# Patient Record
Sex: Male | Born: 1975 | Race: White | Hispanic: No | Marital: Married | Smoking: Never smoker
Health system: Southern US, Community
[De-identification: ages and names within clinical notes are randomized; demographics above are authoritative.]

## PROBLEM LIST (undated history)

## (undated) DIAGNOSIS — I10 Essential (primary) hypertension: Secondary | ICD-10-CM

## (undated) DIAGNOSIS — K602 Anal fissure, unspecified: Secondary | ICD-10-CM

## (undated) DIAGNOSIS — N200 Calculus of kidney: Secondary | ICD-10-CM

## (undated) DIAGNOSIS — E785 Hyperlipidemia, unspecified: Secondary | ICD-10-CM

## (undated) DIAGNOSIS — E039 Hypothyroidism, unspecified: Secondary | ICD-10-CM

## (undated) DIAGNOSIS — Z8719 Personal history of other diseases of the digestive system: Secondary | ICD-10-CM

## (undated) HISTORY — DX: Hyperlipidemia, unspecified: E78.5

## (undated) HISTORY — DX: Essential (primary) hypertension: I10

## (undated) HISTORY — DX: Hypothyroidism, unspecified: E03.9

## (undated) HISTORY — PX: TONSILLECTOMY: SUR1361

## (undated) HISTORY — DX: Calculus of kidney: N20.0

## (undated) HISTORY — DX: Anal fissure, unspecified: K60.2

## (undated) HISTORY — PX: VASECTOMY: SHX75

---

## 2006-12-28 DIAGNOSIS — K602 Anal fissure, unspecified: Secondary | ICD-10-CM

## 2006-12-28 HISTORY — DX: Anal fissure, unspecified: K60.2

## 2009-02-28 ENCOUNTER — Other Ambulatory Visit: Admission: RE | Admit: 2009-02-28 | Discharge: 2009-02-28 | Payer: Self-pay | Admitting: Urology

## 2011-08-06 ENCOUNTER — Encounter: Payer: Self-pay | Admitting: Internal Medicine

## 2011-08-11 ENCOUNTER — Ambulatory Visit: Payer: Self-pay | Admitting: Internal Medicine

## 2011-09-07 ENCOUNTER — Encounter: Payer: Self-pay | Admitting: Internal Medicine

## 2011-09-07 ENCOUNTER — Ambulatory Visit (INDEPENDENT_AMBULATORY_CARE_PROVIDER_SITE_OTHER): Payer: Managed Care, Other (non HMO) | Admitting: Internal Medicine

## 2011-09-07 VITALS — BP 134/80 | HR 68 | Ht 73.0 in | Wt 192.0 lb

## 2011-09-07 DIAGNOSIS — I10 Essential (primary) hypertension: Secondary | ICD-10-CM | POA: Insufficient documentation

## 2011-09-07 DIAGNOSIS — K625 Hemorrhage of anus and rectum: Secondary | ICD-10-CM

## 2011-09-07 NOTE — Patient Instructions (Signed)
You have been scheduled for a flexible sigmoidoscopy.  Please see attached paperwork for instructions

## 2011-09-07 NOTE — Progress Notes (Signed)
Subjective:    Patient ID: James Blackburn, male    DOB: 29-Sep-1976, 35 y.o.   MRN: [id number]  HPI James Blackburn is a 35 year old male, accompanied today by his wife, seen in consultation at the request of Mr. Helene Kelp, PA-C for evaluation of BRBPR.  The patient states that over the last few months to as long as a year he has noted bright red blood on the toilet tissue with wiping. He also has, on rare occasion seen small amount around blood on the stool. He denies any other symptoms including no abdominal pain, nausea, vomiting, or weight loss. He reports his appetite is very good. He denies melena. He also has had no perianal pain. He denies tenesmus.  He reports very rare heartburn.  Regarding his stool, he reports usually having one to 2 formed brown stools per day. He denies constipation or the need to strain at stool. He also denies diarrhea. He's had no fevers or chills.  Review of Systems Constitutional: Negative for fever, chills, night sweats, activity change, appetite change and unexpected weight change HEENT: Negative for sore throat, mouth sores and trouble swallowing. Eyes: Negative for visual disturbance Respiratory: Negative for cough, chest tightness and shortness of breath Cardiovascular: Negative for chest pain, palpitations and lower extremity swelling Gastrointestinal: See history of present illness Genitourinary: Negative for dysuria and hematuria. Musculoskeletal: Negative for back pain, arthralgias and myalgias Skin: Negative for rash or color change Neurological: Negative for headaches, weakness, numbness Hematological: Negative for adenopathy, negative for easy bruising/bleeding Psychiatric/behavioral: Negative for depressed mood, negative for anxiety  PMH:  1.HTN  PSH: none  Current outpatient prescriptions:lisinopril (PRINIVIL,ZESTRIL) 20 MG tablet, One tablet by mouth once daily , Disp: , Rfl: ;  hydrocortisone (ANUSOL-HC) 25 MG suppository, Place 25 mg  rectally 2 (two) times daily as needed.  , Disp: , Rfl:   Allergies  Allergen Reactions  . Amoxicillin    Family History  Problem Relation Age of Onset  . Colon cancer Neg Hx   -Neg IBD, neg colon polyps. Father died of suicide Mother died of COPD  Social History  . Marital Status: Married    Number of Children: 2  . Years of Education: N/A   Occupational History  . [employer]   Social History Main Topics  . Smoking status: Never Smoker   . Smokeless tobacco: Never Used  . Alcohol Use: No  . Drug Use: No   Social History Narrative   3 caffeine drinks daily       Objective:   Physical Exam BP 134/80  Pulse 68  Ht 6\' 1"  (1.854 m)  Wt 192 lb (87.091 kg)  BMI 25.33 kg/m2 Constitutional: Well-developed and well-nourished. No distress. HEENT: Normocephalic and atraumatic. Oropharynx is clear and moist. No oropharyngeal exudate. Conjunctivae are normal. Pupils are equal round and reactive to light. No scleral icterus. Neck: Neck supple. Trachea midline. Cardiovascular: Normal rate, regular rhythm and intact distal pulses. No M/R/G Pulmonary/chest: Effort normal and breath sounds normal. No wheezing, rales or rhonchi. Abdominal: Soft, nontender, nondistended. Bowel sounds active throughout. There are no masses palpable. No hepatosplenomegaly. Rectal: External exam is normal with no emesis hemorrhoid or fissure. Internal exam with no masses, scant brown stool in the vault Lymphadenopathy: No cervical adenopathy noted. Neurological: Alert and oriented to person place and time. Skin: Erythema on sun-exposed areas consistent with sunbrun, Skin is warm and dry. No rashes noted. Psychiatric: Normal mood and affect. Behavior is normal.  Labs 07/31/2011 Fecal  occult blood negative WBC 5.9 hemolymph 15.6 hematocrit 45.1 MCV 90.1 platelet count 237 Sodium 141 potassium 3.7 chloride 103 CO2 26 BUN 8 creatinine 0.75 glucose 103 Total bili 0.6 alkaline phosphatase a 63 AST  28 ALT 33 Total protein 7.3 Alb 4.4 calcium 9.5     Assessment & Plan:  This is a 35 year old male with a past medical history of hypertension who presents with scant rectal bleeding over a year period.  1. Intermittent rectal bleeding - the patient has minimal symptoms however given the length of time that he has seen blood associated with bowel movement we will proceed with a flexible sigmoidoscopy. He would prefer this to be sedated and therefore we will arrange this for him. I do not see any evidence of external hemorrhoid or fissure on exam today. Flexible sigmoidoscopy will examine for inflammation/proctitis as well as internal hemorrhoids.  The need for follow will be based on findings a flexible sigmoidoscopy. I have asked the patient to contact my office should symptoms change or worsen prior to this exam.

## 2011-09-29 ENCOUNTER — Other Ambulatory Visit: Payer: Managed Care, Other (non HMO) | Admitting: Internal Medicine

## 2012-02-05 ENCOUNTER — Ambulatory Visit
Admission: RE | Admit: 2012-02-05 | Discharge: 2012-02-05 | Disposition: A | Discharge status: Home / Self Care | Payer: Managed Care, Other (non HMO) | Source: Ambulatory Visit | Attending: Orthopedic Surgery | Admitting: Orthopedic Surgery

## 2012-02-05 ENCOUNTER — Other Ambulatory Visit: Payer: Self-pay | Admitting: Orthopedic Surgery

## 2012-02-05 DIAGNOSIS — R52 Pain, unspecified: Secondary | ICD-10-CM

## 2012-02-26 ENCOUNTER — Ambulatory Visit
Admission: RE | Admit: 2012-02-26 | Discharge: 2012-02-26 | Disposition: A | Discharge status: Home / Self Care | Payer: Commercial Indemnity | Source: Ambulatory Visit | Attending: Orthopedic Surgery | Admitting: Orthopedic Surgery

## 2012-02-26 ENCOUNTER — Other Ambulatory Visit: Payer: Self-pay | Admitting: Orthopedic Surgery

## 2012-02-26 DIAGNOSIS — M25539 Pain in unspecified wrist: Secondary | ICD-10-CM

## 2012-02-26 DIAGNOSIS — M25531 Pain in right wrist: Secondary | ICD-10-CM

## 2012-10-21 IMAGING — CR DG WRIST 2V*L*
1 series · 1 of 1 positions shown · non-contrast
Comparison: Radiographs dated 02/05/2012

CLINICAL DATA: Persistent right wrist pain and limited range of
motion since distal radius fracture.

LEFT WRIST - 1 view clinched fist

[view not recorded]
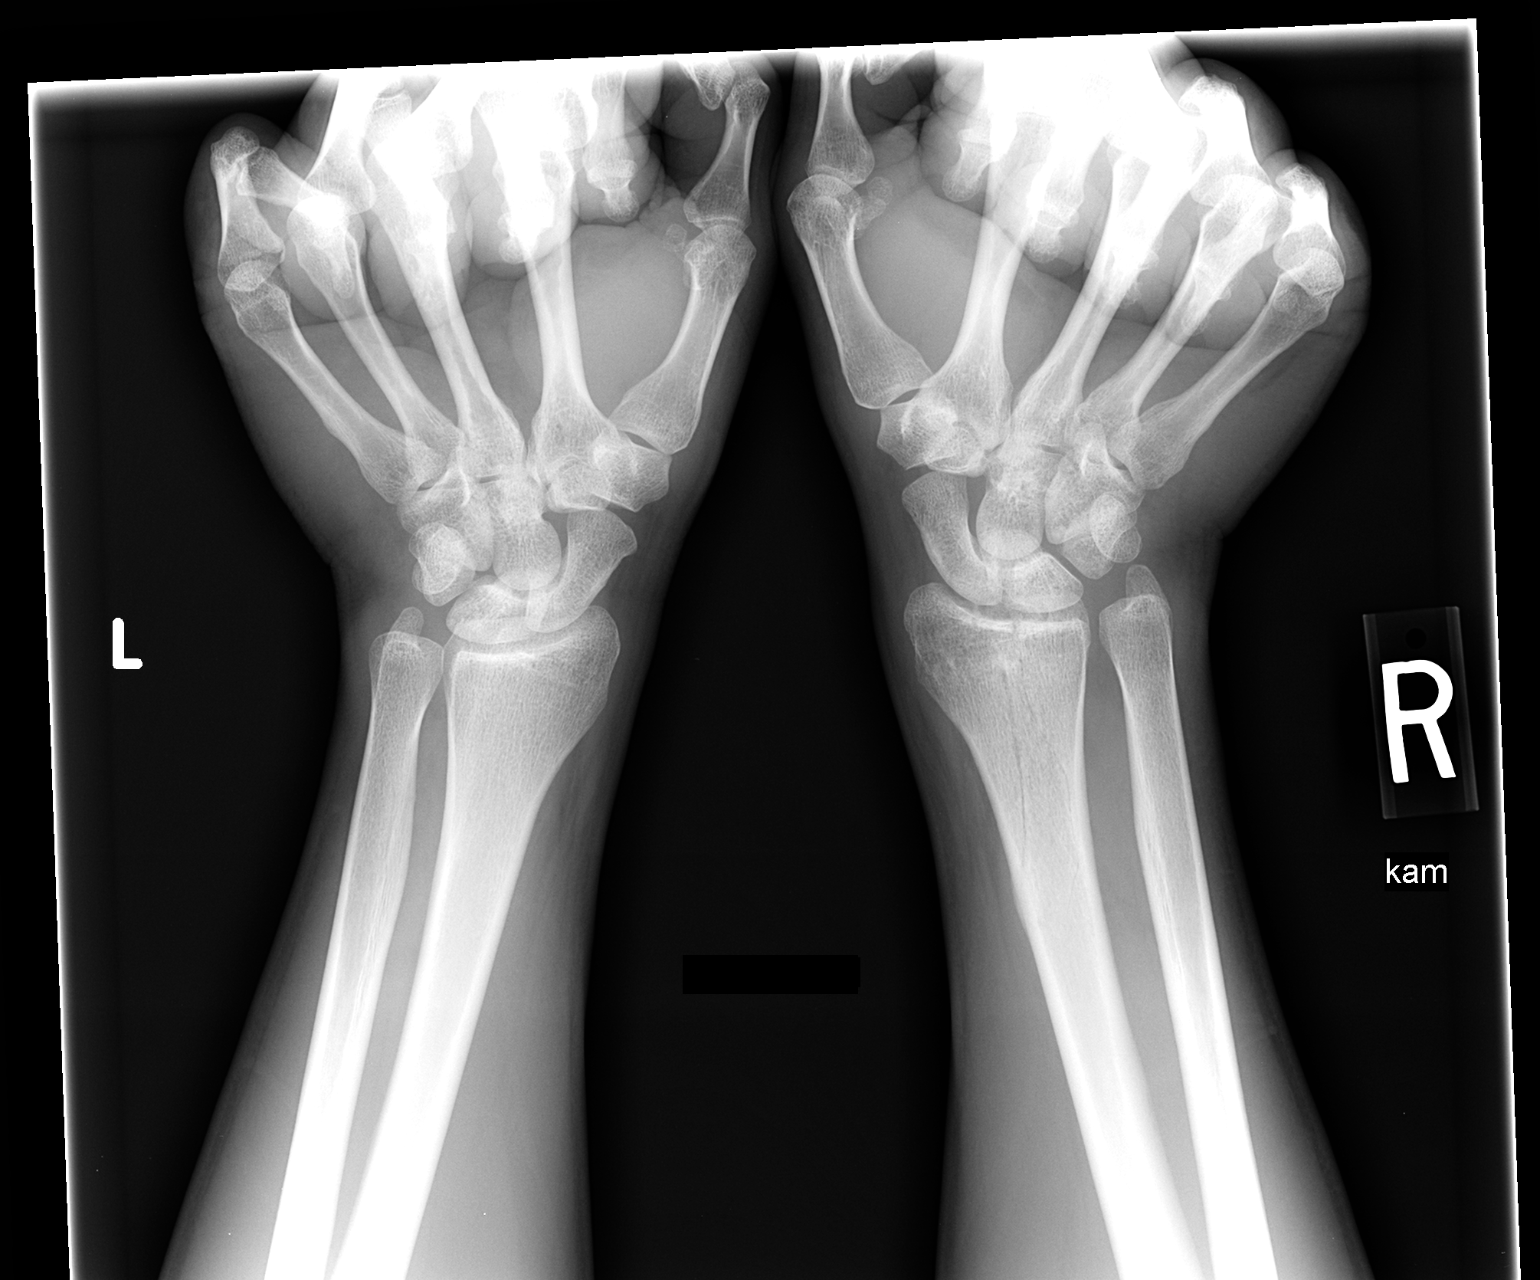

[1 of 1 positions shown; findings below may reference images not displayed]

FINDINGS: There is no appreciable widening of the scapholunate
distance in the right or left wrist.

However, there is abnormal widening of the right distal radial
ulnar joint as compared to the left.

The patient has incomplete healing of the fracture of the distal
right radius.  Other osseous structures are normal.
IMPRESSION: 1.  Abnormal widening of the distal right radial ulnar joint in the
clenched fist position.
2.  No abnormal widening of the scapholunate space.
3.  Incomplete healing of the distal radius fracture.

## 2013-05-23 ENCOUNTER — Telehealth: Payer: Self-pay | Admitting: Family Medicine

## 2013-05-23 ENCOUNTER — Ambulatory Visit (INDEPENDENT_AMBULATORY_CARE_PROVIDER_SITE_OTHER): Payer: Managed Care, Other (non HMO) | Admitting: Family Medicine

## 2013-05-23 ENCOUNTER — Encounter: Payer: Self-pay | Admitting: Family Medicine

## 2013-05-23 ENCOUNTER — Ambulatory Visit (INDEPENDENT_AMBULATORY_CARE_PROVIDER_SITE_OTHER): Payer: Managed Care, Other (non HMO)

## 2013-05-23 ENCOUNTER — Ambulatory Visit
Admission: RE | Admit: 2013-05-23 | Discharge: 2013-05-23 | Disposition: A | Discharge status: Home / Self Care | Payer: Managed Care, Other (non HMO) | Source: Ambulatory Visit | Attending: Family Medicine | Admitting: Family Medicine

## 2013-05-23 VITALS — BP 138/89 | HR 69 | Temp 98.0°F | Ht 73.0 in | Wt 200.0 lb

## 2013-05-23 DIAGNOSIS — N200 Calculus of kidney: Secondary | ICD-10-CM

## 2013-05-23 DIAGNOSIS — R319 Hematuria, unspecified: Secondary | ICD-10-CM

## 2013-05-23 DIAGNOSIS — E785 Hyperlipidemia, unspecified: Secondary | ICD-10-CM

## 2013-05-23 LAB — POCT UA - MICROSCOPIC ONLY
Bacteria, U Microscopic: NEGATIVE
Crystals, Ur, HPF, POC: NEGATIVE
Epithelial cells, urine per micros: NEGATIVE
WBC, Ur, HPF, POC: NEGATIVE

## 2013-05-23 LAB — POCT URINALYSIS DIPSTICK
Bilirubin, UA: NEGATIVE
Glucose, UA: NEGATIVE
Spec Grav, UA: 1.015
Urobilinogen, UA: NEGATIVE

## 2013-05-23 NOTE — Patient Instructions (Addendum)
Drink lots of fluids Try  using filter, to attempt to catch stone If in severe pain call sooner

## 2013-05-23 NOTE — Addendum Note (Signed)
Addended by: Gwenith Daily on: 05/23/2013 12:48 PM   Modules accepted: Orders

## 2013-05-23 NOTE — Telephone Encounter (Signed)
APPT MADE

## 2013-05-23 NOTE — Progress Notes (Signed)
  Subjective:    Patient ID: James Blackburn, male    DOB: 10/23/1976, 37 y.o.   MRN: [id number]  HPI Patient notes having blood in the urine since Saturday evening which was 3 days ago. The urine was pretty dark that particular night. He does have playing basketball with his son that day. He has had slight increase in frequency. He notes no urgency or stinging with voiding. He did have some slight testicular pain yesterday. He denies previous history of stone or a family history of stones. Last week there was some slight left flank pain. Since the bleeding started he noticed that it seems to be clearing more today.  Review of Systems  Genitourinary: Positive for hematuria (started on Saturday). Negative for urgency, frequency, flank pain and difficulty urinating.       Objective:   Physical Exam  Constitutional: He is oriented to person, place, and time. He appears well-developed and well-nourished.  HENT:  Head: Normocephalic.  Eyes: Conjunctivae are normal.  Neck: Normal range of motion.  Abdominal: Soft. Bowel sounds are normal. He exhibits no mass. There is tenderness (slight suprapubic). There is no rebound and no guarding.  Genitourinary: Penis normal. No penile tenderness.  Testicles were normal bilaterally. There was a small left inguinal hernia.  Musculoskeletal: Normal range of motion.  Neurological: He is alert and oriented to person, place, and time.  Skin: Skin is warm and dry.  Psychiatric: He has a normal mood and affect. His behavior is normal. Judgment and thought content normal.   [facility] reading (PRIMARY) by  Dr. Christell Constant; KUB-- no obvious stone                                        Assessment & Plan:  1. Hematuria -Urinalysis  2. Suprapubic pain  3. Small left inguinal hernia  Patient Instructions  Drink lots of fluids Try  using filter, to attempt to catch stone If in severe pain call sooner   Will get CT scan of abdomen and pelvis

## 2013-05-26 ENCOUNTER — Telehealth: Payer: Self-pay | Admitting: *Deleted

## 2013-05-26 NOTE — Telephone Encounter (Signed)
[name] last week for kidney stones. Unable to pass any stones. Had CT done that did show stones. DW[facility] told to call back if unable to pass stones and having bleeding and if having pain with urination. Please advise

## 2013-05-26 NOTE — Telephone Encounter (Signed)
Will have to go to er- To late in day for urology referral

## 2013-05-27 NOTE — Telephone Encounter (Signed)
Patient aware.

## 2013-05-29 ENCOUNTER — Telehealth: Payer: Self-pay | Admitting: Family Medicine

## 2013-05-29 NOTE — Telephone Encounter (Signed)
Please try to get pt's appt with urology ASAP due to worsening hematuria

## 2013-06-12 ENCOUNTER — Ambulatory Visit: Payer: Managed Care, Other (non HMO) | Admitting: Physician Assistant

## 2013-06-12 ENCOUNTER — Other Ambulatory Visit: Payer: Self-pay | Admitting: Urology

## 2013-06-19 ENCOUNTER — Encounter (HOSPITAL_COMMUNITY): Payer: Self-pay | Admitting: Pharmacy Technician

## 2013-06-20 ENCOUNTER — Encounter (HOSPITAL_COMMUNITY): Payer: Self-pay | Admitting: *Deleted

## 2013-06-21 NOTE — H&P (Signed)
ctive Problems Problems  1. Nephrolithiasis 592.0  History of Present Illness    James Blackburn is a 37 yo WM sent in consultation by Dr. Christell Constant for right renal stones.   He had the onset last month of gross hematuria.  He has had some intermittant pain in the right flank.  It can be severe and radiates to the groin.  He had a CT that showed a 7mm right renal pelvic stone and a 2 mm stones.   He has had no prior stones or GU surgery.  He has had no frequency or urgency.   Past Medical History Problems  1. History of  Hypercholesterolemia 272.0 2. History of  Hypertension 401.9 3. History of  Nephrolithiasis V13.01  Surgical History Problems  1. History of  Surgery Of Male Genitalia Vasectomy V25.2  Current Meds 1. Lisinopril 20 MG Oral Tablet; Therapy: 26Dec2013 to  Allergies Medication  1. Amoxicillin CAPS  Family History Problems  1. Family history of  Death In The Family Father 2. Family history of  Death In The Family Mother 3. Family history of  Family Health Status Number Of Children  Social History Problems    Caffeine Use   Marital History - Currently Married   Occupation: Denied    History of  Alcohol Use   History of  Tobacco Use V15.82  Review of Systems Genitourinary, constitutional, skin, eye, otolaryngeal, hematologic/lymphatic, cardiovascular, pulmonary, endocrine, musculoskeletal, gastrointestinal, neurological and psychiatric system(s) were reviewed and pertinent findings if present are noted.  Genitourinary: urinary frequency and hematuria.  Gastrointestinal: flank pain.    Vitals Vital Signs [Data Includes: Last 1 Day]  16Jun2014 01:59PM  BMI Calculated: 26.51 BSA Calculated: 2.15 Height: 6 ft 1 in Weight: 200 lb  Blood Pressure: 135 / 96 Temperature: 98.3 F Heart Rate: 84  Physical Exam Constitutional: Well nourished and well developed . No acute distress.  ENT:. The ears and nose are normal in appearance.  Neck: The appearance of the neck  is normal and no neck mass is present.  Pulmonary: No respiratory distress and normal respiratory rhythm and effort.  Cardiovascular: Heart rate and rhythm are normal . No peripheral edema.  Abdomen: No masses are palpated. The abdomen is not firm, not rigid and no rebound. Tenderness in the RLQ is present. mild right CVA tenderness.  A right inguinal hernia is present.  A left inguinal hernia is present. No hepatosplenomegaly noted.  Genitourinary: Examination of the penis demonstrates no discharge, no masses, no lesions and a normal meatus. The scrotum is without lesions. The right epididymis is palpably normal and non-tender. The left epididymis is palpably normal and non-tender. The right testis is non-tender and without masses. The left testis is non-tender and without masses.  Lymphatics: The femoral and inguinal nodes are not enlarged or tender.  Skin: Normal skin turgor, no visible rash and no visible skin lesions.  Neuro/Psych:. Mood and affect are appropriate.    Results/Data Urine [Data Includes: Last 1 Day]   16Jun2014  COLOR YELLOW   APPEARANCE CLOUDY   SPECIFIC GRAVITY 1.020   pH 6.5   GLUCOSE NEG mg/dL  BILIRUBIN NEG   KETONE NEG mg/dL  BLOOD LARGE   PROTEIN NEG mg/dL  UROBILINOGEN 0.2 mg/dL  NITRITE NEG   LEUKOCYTE ESTERASE NEG   SQUAMOUS EPITHELIAL/HPF RARE   WBC 0-2 WBC/hpf  RBC TNTC RBC/hpf  BACTERIA FEW   CRYSTALS NONE SEEN   CASTS NONE SEEN   Other MUCUS NOTED    Old records  or history reviewed: I have reviewed records from Dr. Christell Constant.  The following images/tracing/specimen were independently visualized:  I have reviewed his CT films and report. KUB today shows the stone has moved into the right proximal ureter and is 8x77mm in size. The films is otherwise unremarkable.    Assessment Assessed  1. Proximal Ureteral Stone On The Right 592.1   He has a symptomatic right proximal stone but took ibuprofen this morning.   Plan Health Maintenance (V70.0)  1.  UA With REFLEX  Done: 16Jun2014 01:39PM Nephrolithiasis (592.0)  2. KUB  Done: 16Jun2014 12:00AM Proximal Ureteral Stone On The Right (592.1)  3. Oxycodone-Acetaminophen 5-325 MG Oral Tablet; take 1 or 2 tablets q 4-6 hours prn pain;  Therapy: 16Jun2014 to (Last Rx:16Jun2014) 4. Follow-up Schedule Surgery Office  Follow-up  Requested for: 16Jun2014   I discussed the options including observation, ESWL and ureteroscopy and will set him up for ESWL later this week.   I reviewed the risks of bleeding, infection, renal and adjacent organ injury that can be life threatening or fatal, thrombotic events, need for secondary procedures for obstructing fragments and sedation risks.   Discussion/Summary  CC: Dr. Vernon Prey.

## 2013-06-22 ENCOUNTER — Ambulatory Visit (HOSPITAL_COMMUNITY): Payer: Managed Care, Other (non HMO)

## 2013-06-22 ENCOUNTER — Ambulatory Visit (HOSPITAL_COMMUNITY)
Admission: RE | Admit: 2013-06-22 | Discharge: 2013-06-22 | Disposition: A | Discharge status: Home / Self Care | Payer: Managed Care, Other (non HMO) | Source: Ambulatory Visit | Attending: Urology | Admitting: Urology

## 2013-06-22 ENCOUNTER — Encounter (HOSPITAL_COMMUNITY): Payer: Self-pay

## 2013-06-22 ENCOUNTER — Encounter (HOSPITAL_COMMUNITY)
Admission: RE | Disposition: A | Discharge status: Home / Self Care | Payer: Self-pay | Source: Ambulatory Visit | Attending: Urology

## 2013-06-22 DIAGNOSIS — I10 Essential (primary) hypertension: Secondary | ICD-10-CM | POA: Insufficient documentation

## 2013-06-22 DIAGNOSIS — N200 Calculus of kidney: Secondary | ICD-10-CM | POA: Insufficient documentation

## 2013-06-22 DIAGNOSIS — E7800 Pure hypercholesterolemia, unspecified: Secondary | ICD-10-CM | POA: Insufficient documentation

## 2013-06-22 DIAGNOSIS — K4020 Bilateral inguinal hernia, without obstruction or gangrene, not specified as recurrent: Secondary | ICD-10-CM | POA: Insufficient documentation

## 2013-06-22 DIAGNOSIS — Z880 Allergy status to penicillin: Secondary | ICD-10-CM | POA: Insufficient documentation

## 2013-06-22 DIAGNOSIS — Z79899 Other long term (current) drug therapy: Secondary | ICD-10-CM | POA: Insufficient documentation

## 2013-06-22 DIAGNOSIS — Z9852 Vasectomy status: Secondary | ICD-10-CM | POA: Insufficient documentation

## 2013-06-22 HISTORY — DX: Personal history of other diseases of the digestive system: Z87.19

## 2013-06-22 SURGERY — LITHOTRIPSY, ESWL
Anesthesia: LOCAL | Laterality: Right

## 2013-06-22 MED ORDER — ACETAMINOPHEN 325 MG PO TABS: 650.0000 mg | ORAL_TABLET | ORAL | Status: DC | PRN

## 2013-06-22 MED ORDER — CIPROFLOXACIN HCL 500 MG PO TABS
500.0000 mg | ORAL_TABLET | ORAL | Status: AC
  Administered 2013-06-22: 500 mg via ORAL
  Filled 2013-06-22: qty 1

## 2013-06-22 MED ORDER — SODIUM CHLORIDE 0.9 % IV SOLN: 250.0000 mL | INTRAVENOUS | Status: DC | PRN

## 2013-06-22 MED ORDER — DEXTROSE-NACL 5-0.45 % IV SOLN
INTRAVENOUS | Status: DC
  Administered 2013-06-22: 10:00:00 via INTRAVENOUS

## 2013-06-22 MED ORDER — TAMSULOSIN HCL 0.4 MG PO CAPS: 0.4000 mg | ORAL_CAPSULE | Freq: Every day | ORAL | Status: DC

## 2013-06-22 MED ORDER — ONDANSETRON 8 MG PO TBDP: 8.0000 mg | ORAL_TABLET | Freq: Three times a day (TID) | ORAL | Status: DC | PRN

## 2013-06-22 MED ORDER — DIAZEPAM 5 MG PO TABS
10.0000 mg | ORAL_TABLET | ORAL | Status: AC
  Administered 2013-06-22: 10 mg via ORAL
  Filled 2013-06-22: qty 2

## 2013-06-22 MED ORDER — OXYCODONE-ACETAMINOPHEN 5-325 MG PO TABS: 1.0000 | ORAL_TABLET | ORAL | Status: DC | PRN

## 2013-06-22 MED ORDER — ACETAMINOPHEN 650 MG RE SUPP
650.0000 mg | RECTAL | Status: DC | PRN
  Filled 2013-06-22: qty 1

## 2013-06-22 MED ORDER — ONDANSETRON HCL 4 MG/2ML IJ SOLN: 4.0000 mg | Freq: Four times a day (QID) | INTRAMUSCULAR | Status: DC | PRN

## 2013-06-22 MED ORDER — OXYCODONE HCL 5 MG PO TABS
5.0000 mg | ORAL_TABLET | ORAL | Status: DC | PRN
  Administered 2013-06-22: 10 mg via ORAL
  Filled 2013-06-22: qty 2

## 2013-06-22 MED ORDER — DIPHENHYDRAMINE HCL 25 MG PO CAPS
25.0000 mg | ORAL_CAPSULE | ORAL | Status: AC
  Administered 2013-06-22: 25 mg via ORAL
  Filled 2013-06-22: qty 1

## 2013-06-22 MED ORDER — SODIUM CHLORIDE 0.9 % IJ SOLN: 3.0000 mL | INTRAMUSCULAR | Status: DC | PRN

## 2013-06-22 MED ORDER — FENTANYL CITRATE 0.05 MG/ML IJ SOLN: 25.0000 ug | INTRAMUSCULAR | Status: DC | PRN

## 2013-06-22 MED ORDER — SODIUM CHLORIDE 0.9 % IJ SOLN: 3.0000 mL | Freq: Two times a day (BID) | INTRAMUSCULAR | Status: DC

## 2013-07-03 ENCOUNTER — Other Ambulatory Visit: Payer: Self-pay | Admitting: *Deleted

## 2013-07-03 ENCOUNTER — Other Ambulatory Visit: Payer: Self-pay | Admitting: Family Medicine

## 2013-07-03 ENCOUNTER — Telehealth: Payer: Self-pay | Admitting: Family Medicine

## 2013-07-03 DIAGNOSIS — I10 Essential (primary) hypertension: Secondary | ICD-10-CM

## 2013-07-03 MED ORDER — LISINOPRIL 20 MG PO TABS: 20.0000 mg | ORAL_TABLET | Freq: Every day | ORAL | Status: DC

## 2013-07-03 NOTE — Telephone Encounter (Addendum)
Spoke with pt and was on vacation last week and needs bp med asap  lisonpril 20 mg bp was 160/98 this am and last week when had liportripsy was 160/98  Needs office visit and was sch for Thursday.  Send [facility]

## 2013-07-03 NOTE — Telephone Encounter (Signed)
Patient out of BP med needs this called in asap he called it in this morning and no one has called him back needs to be done asap please call him when ready

## 2013-07-03 NOTE — Telephone Encounter (Signed)
Out of bp med today, appt for tomorrow with [facility]. Needs refill.

## 2013-07-04 ENCOUNTER — Ambulatory Visit: Payer: Managed Care, Other (non HMO) | Admitting: Family Medicine

## 2013-07-06 ENCOUNTER — Encounter: Payer: Self-pay | Admitting: Family Medicine

## 2013-07-06 ENCOUNTER — Ambulatory Visit (INDEPENDENT_AMBULATORY_CARE_PROVIDER_SITE_OTHER): Payer: Managed Care, Other (non HMO) | Admitting: Family Medicine

## 2013-07-06 VITALS — BP 143/83 | HR 77 | Temp 97.4°F | Ht 73.0 in | Wt 200.0 lb

## 2013-07-06 DIAGNOSIS — I10 Essential (primary) hypertension: Secondary | ICD-10-CM

## 2013-07-06 DIAGNOSIS — E785 Hyperlipidemia, unspecified: Secondary | ICD-10-CM

## 2013-07-06 MED ORDER — ATORVASTATIN CALCIUM 40 MG PO TABS: 40.0000 mg | ORAL_TABLET | Freq: Every day | ORAL | Status: DC

## 2013-07-06 MED ORDER — LISINOPRIL 20 MG PO TABS: 20.0000 mg | ORAL_TABLET | Freq: Every day | ORAL | Status: DC

## 2013-07-06 NOTE — Patient Instructions (Addendum)

## 2013-07-06 NOTE — Progress Notes (Signed)
  Subjective:    Patient ID: James Blackburn, male    DOB: Oct 03, 1976, 37 y.o.   MRN: [id number]  HPI This 37 y.o. male presents for evaluation of hypertension and hyperlipidemia.  He needs refills on his lipitor and his  Lisinopril.   Review of Systems No chest pain, SOB, HA, dizziness, vision change, N/V, diarrhea, constipation, dysuria, urinary urgency or frequency, myalgias, arthralgias or rash.     Objective:   Physical Exam  Vital signs noted  Well developed well nourished male.  HEENT - Head atraumatic Normocephalic                Eyes - PERRLA, Conjuctiva - clear Sclera- Clear EOMI                Ears - EAC's Wnl TM's Wnl Gross Hearing WNL                Nose - Nares patent                 Throat - oropharanx wnl Respiratory - Lungs CTA bilateral Cardiac - RRR S1 and S2 without murmur GI - Abdomen soft Nontender and bowel sounds active x 4 Extremities - No edema. Neuro - Grossly intact.      Assessment & Plan:  HTN (hypertension) - Plan: lisinopril (PRINIVIL,ZESTRIL) 20 MG tablet, atorvastatin (LIPITOR) 40 MG tablet Exercise and recommend DASH diet, follow up in 6 months.  Other and unspecified hyperlipidemia - Continue Atrovastatin 40mg  po qd.  Follow up in 6 months.

## 2013-08-14 ENCOUNTER — Telehealth: Payer: Self-pay | Admitting: Family Medicine

## 2013-08-14 NOTE — Telephone Encounter (Signed)
Appt given for today 

## 2013-08-15 ENCOUNTER — Encounter: Payer: Self-pay | Admitting: Family Medicine

## 2013-08-15 ENCOUNTER — Ambulatory Visit (INDEPENDENT_AMBULATORY_CARE_PROVIDER_SITE_OTHER): Payer: Managed Care, Other (non HMO) | Admitting: Family Medicine

## 2013-08-15 VITALS — BP 135/94 | HR 84 | Temp 97.7°F | Ht 73.0 in | Wt 198.6 lb

## 2013-08-15 DIAGNOSIS — L259 Unspecified contact dermatitis, unspecified cause: Secondary | ICD-10-CM

## 2013-08-15 MED ORDER — MOMETASONE FUROATE 0.1 % EX CREA: TOPICAL_CREAM | Freq: Every day | CUTANEOUS | Status: DC

## 2013-08-15 MED ORDER — METHYLPREDNISOLONE SODIUM SUCC 125 MG IJ SOLR
125.0000 mg | Freq: Once | INTRAMUSCULAR | Status: AC
  Administered 2013-08-15: 125 mg via INTRAMUSCULAR

## 2013-08-15 MED ORDER — HYDROXYZINE HCL 25 MG PO TABS: 25.0000 mg | ORAL_TABLET | ORAL | Status: DC | PRN

## 2013-08-15 MED ORDER — METHYLPREDNISOLONE 4 MG PO KIT: PACK | ORAL | Status: DC

## 2013-08-15 MED ORDER — SODIUM CHLORIDE 0.9 % IV SOLN: 125.0000 mg | Freq: Once | INTRAVENOUS | Status: DC

## 2013-08-15 NOTE — Progress Notes (Signed)
  Subjective:    Patient ID: James Blackburn, male    DOB: [birth year], 37 y.o.   MRN: [id number]  HPI  This 37 y.o. male presents for evaluation of contact dermatitis from poison oak after working out in the yard. He has been using benadryl and hydrocortisone cream which is not helping.  Review of Systems C/o rash and pruritis   No chest pain, SOB, HA, dizziness, vision change, N/V, diarrhea, constipation, dysuria, urinary urgency or frequency, myalgias, arthralgias.  Objective:   Physical Exam Vital signs noted  Well developed well nourished male.  HEENT - Head atraumatic Normocephalic                Eyes - PERRLA, Conjuctiva - clear Sclera- Clear EOMI                Ears - EAC's Wnl TM's Wnl Gross Hearing WNL                Nose - Nares patent                 Throat - oropharanx wnl Respiratory - Lungs CTA bilateral Cardiac - RRR S1 and S2 without murmur Skin - Erythematous blisters and vesicles over hands, arms, wrists, and abdomen with yellow exudate.       Assessment & Plan:  Contact dermatitis - Plan: methylPREDNISolone (MEDROL DOSEPAK) 4 MG tablet, mometasone (ELOCON) 0.1 % cream, hydrOXYzine (ATARAX/VISTARIL) tablet 25 mg, Solumedrol 125mg  IM.  Aveeno baths and follow up prn if not better.

## 2013-08-15 NOTE — Addendum Note (Signed)
Addended by: Pura Spice on: 08/15/2013 05:41 PM   Modules accepted: Orders

## 2013-08-15 NOTE — Progress Notes (Signed)
Tolerated solu medrol 125 mg IM without difficulty.

## 2013-08-15 NOTE — Patient Instructions (Addendum)
Contact Dermatitis Contact dermatitis is a reaction to certain substances that touch the skin. Contact dermatitis can be either irritant contact dermatitis or allergic contact dermatitis. Irritant contact dermatitis does not require previous exposure to the substance for a reaction to occur.Allergic contact dermatitis only occurs if you have been exposed to the substance before. Upon a repeat exposure, your body reacts to the substance.  CAUSES  Many substances can cause contact dermatitis. Irritant dermatitis is most commonly caused by repeated exposure to mildly irritating substances, such as:  Makeup.  Soaps.  Detergents.  Bleaches.  Acids.  Metal salts, such as nickel. Allergic contact dermatitis is most commonly caused by exposure to:  Poisonous plants.  Chemicals (deodorants, shampoos).  Jewelry.  Latex.  Neomycin in triple antibiotic cream.  Preservatives in products, including clothing. SYMPTOMS  The area of skin that is exposed may develop:  Dryness or flaking.  Redness.  Cracks.  Itching.  Pain or a burning sensation.  Blisters. With allergic contact dermatitis, there may also be swelling in areas such as the eyelids, mouth, or genitals.  DIAGNOSIS  Your caregiver can usually tell what the problem is by doing a physical exam. In cases where the cause is uncertain and an allergic contact dermatitis is suspected, a patch skin test may be performed to help determine the cause of your dermatitis. TREATMENT Treatment includes protecting the skin from further contact with the irritating substance by avoiding that substance if possible. Barrier creams, powders, and gloves may be helpful. Your caregiver may also recommend:  Steroid creams or ointments applied 2 times daily. For best results, soak the rash area in cool water for 20 minutes. Then apply the medicine. Cover the area with a plastic wrap. You can store the steroid cream in the refrigerator for a "chilly"  effect on your rash. That may decrease itching. Oral steroid medicines may be needed in more severe cases.  Antibiotics or antibacterial ointments if a skin infection is present.  Antihistamine lotion or an antihistamine taken by mouth to ease itching.  Lubricants to keep moisture in your skin.  Burow's solution to reduce redness and soreness or to dry a weeping rash. Mix one packet or tablet of solution in 2 cups cool water. Dip a clean washcloth in the mixture, wring it out a bit, and put it on the affected area. Leave the cloth in place for 30 minutes. Do this as often as possible throughout the day.  Taking several cornstarch or baking soda baths daily if the area is too large to cover with a washcloth. Harsh chemicals, such as alkalis or acids, can cause skin damage that is like a burn. You should flush your skin for 15 to 20 minutes with cold water after such an exposure. You should also seek immediate medical care after exposure. Bandages (dressings), antibiotics, and pain medicine may be needed for severely irritated skin.  HOME CARE INSTRUCTIONS  Avoid the substance that caused your reaction.  Keep the area of skin that is affected away from hot water, soap, sunlight, chemicals, acidic substances, or anything else that would irritate your skin.  Do not scratch the rash. Scratching may cause the rash to become infected.  You may take cool baths to help stop the itching.  Only take over-the-counter or prescription medicines as directed by your caregiver.  See your caregiver for follow-up care as directed to make sure your skin is healing properly. SEEK MEDICAL CARE IF:   Your condition is not better after 3   days of treatment.  You seem to be getting worse.  You see signs of infection such as swelling, tenderness, redness, soreness, or warmth in the affected area.  You have any problems related to your medicines. Document Released: 12/11/2000 Document Revised: 03/07/2012  Document Reviewed: 05/19/2011 [facility] Patient Information 2014 [place], [place]. Methylprednisolone Solution for Injection What is this medicine? METHYLPREDNISOLONE (meth ill pred NISS oh lone) is a corticosteroid. It is commonly used to treat inflammation of the skin, joints, lungs, and other organs. Common conditions treated include asthma, allergies, and arthritis. It is also used for other conditions, such as blood disorders and diseases of the adrenal glands. This medicine may be used for other purposes; ask your health care provider or pharmacist if you have questions. What should I tell my health care provider before I take this medicine? They need to know if you have any of these conditions: -cataracts or glaucoma -Cushing's syndrome -heart disease -high blood pressure -infection including tuberculosis -low calcium or potassium levels in the blood -recent surgery -seizures -stomach or intestinal disease, including colitis -threadworms -thyroid problems -an unusual or allergic reaction to methylprednisolone, corticosteroids, benzyl alcohol, other medicines, foods, dyes, or preservatives -pregnant or trying to get pregnant -breast-feeding How should I use this medicine? This medicine is for injection or infusion into a vein. It is also for injection into a muscle. It is given by a health care professional in a hospital or clinic setting. Talk to your pediatrician regarding the use of this medicine in children. While this drug may be prescribed for selected conditions, precautions do apply. Overdosage: If you think you have taken too much of this medicine contact a poison control center or emergency room at once. NOTE: This medicine is only for you. Do not share this medicine with others. What if I miss a dose? This does not apply. What may interact with this medicine? Do not take this medicine with any of the following medications: -mifepristone -radiopaque contrast agents This  medicine may also interact with the following medications: -aspirin and aspirin-like medicines -cyclosporin -ketoconazole -phenobarbital -phenytoin -rifampin -tacrolimus -troleandomycin -vaccines -warfarin This list may not describe all possible interactions. Give your health care provider a list of all the medicines, herbs, non-prescription drugs, or dietary supplements you use. Also tell them if you smoke, drink alcohol, or use illegal drugs. Some items may interact with your medicine. What should I watch for while using this medicine? Visit your doctor or health care professional for regular checks on your progress. If you are taking this medicine for a long time, carry an identification card with your name and address, the type and dose of your medicine, and your doctor's name and address. The medicine may increase your risk of getting an infection. Stay away from people who are sick. Tell your doctor or health care professional if you are around anyone with measles or chickenpox. You may need to avoid some vaccines. Talk to your health care provider for more information. If you are going to have surgery, tell your doctor or health care professional that you have taken this medicine within the last twelve months. Ask your doctor or health care professional about your diet. You may need to lower the amount of salt you eat. The medicine can increase your blood sugar. If you are a diabetic check with your doctor if you need help adjusting the dose of your diabetic medicine. What side effects may I notice from receiving this medicine? Side effects that you should report  to your doctor or health care professional as soon as possible: -allergic reactions like skin rash, itching or hives, swelling of the face, lips, or tongue -bloody or tarry stools -changes in vision -eye pain or bulging eyes -fever, sore throat, sneezing, cough, or other signs of infection, wounds that will not  heal -increased thirst -irregular heartbeat -muscle cramps -pain in hips, back, ribs, arms, shoulders, or legs -swelling of the ankles, feet, hands -trouble passing urine or change in the amount of urine -unusual bleeding or bruising -unusually weak or tired -weight gain or weight loss Side effects that usually do not require medical attention (report to your doctor or health care professional if they continue or are bothersome): -changes in emotions or moods -constipation or diarrhea -headache -irritation at site where injected -nausea, vomiting -skin problems, acne, thin and shiny skin -trouble sleeping -unusual hair growth on the face or body This list may not describe all possible side effects. Call your doctor for medical advice about side effects. You may report side effects to FDA at 1-800-FDA-1088. Where should I keep my medicine? This drug is given in a hospital or clinic and will not be stored at home. NOTE: This sheet is a summary. It may not cover all possible information. If you have questions about this medicine, talk to your doctor, pharmacist, or health care provider.  2013, [employer]. (07/02/2008 1:56:07 PM)

## 2013-09-27 ENCOUNTER — Encounter: Payer: Self-pay | Admitting: Family Medicine

## 2013-09-27 ENCOUNTER — Ambulatory Visit (INDEPENDENT_AMBULATORY_CARE_PROVIDER_SITE_OTHER): Payer: Managed Care, Other (non HMO) | Admitting: Family Medicine

## 2013-09-27 VITALS — BP 120/75 | HR 73 | Temp 99.0°F | Ht 73.0 in | Wt 199.8 lb

## 2013-09-27 DIAGNOSIS — M79609 Pain in unspecified limb: Secondary | ICD-10-CM

## 2013-09-27 DIAGNOSIS — M79673 Pain in unspecified foot: Secondary | ICD-10-CM

## 2013-09-27 MED ORDER — NAPROXEN 500 MG PO TABS: 500.0000 mg | ORAL_TABLET | Freq: Two times a day (BID) | ORAL | Status: DC

## 2013-09-27 NOTE — Progress Notes (Signed)
  Subjective:    Patient ID: James Blackburn, male    DOB: 17-Aug-1976, 37 y.o.   MRN: [id number]  HPI This 37 y.o. male presents for evaluation of numbness in lower extremities for 2 weeks. He c/o pain and discomfort in his heels and his shins when he gets up in the am.   Review of Systems C/o bilateral heel and shin discomfort. No chest pain, SOB, HA, dizziness, vision change, N/V, diarrhea, constipation, dysuria, urinary urgency or frequency or rash.     Objective:   Physical Exam Vital signs noted  Well developed well nourished male.  HEENT - Head atraumatic Normocephalic                Eyes - PERRLA, Conjuctiva - clear Sclera- Clear EOMI                Throat - oropharanx wnl Respiratory - Lungs CTA bilateral Cardiac - RRR S1 and S2 without murmur GI - Abdomen soft Nontender and bowel sounds active x 4 MS - TTP bilateral shins and heels.       Assessment & Plan:  Heel pain, unspecified laterality - Plan: naproxen (NAPROSYN) 500 MG tablet po bid x 10 days And discussed doing stretches of heels and feet.  Shin splints - Naprosyn 500mg  one po bid x 10 days.  Deatra Canter FNP

## 2013-09-27 NOTE — Patient Instructions (Addendum)
Shin Splints Shin splints is a painful condition that is felt on the shinbone or in the muscles on either side of the bone (front of your lower leg). Shin splints happen when physical activities, such as sports or other demanding exercise, leads to inflammation of the muscles, tendons, and the thin layer that covers the shinbone.  CAUSES   Overuse of muscles.  Repetitive activities.  Flat feet or rigid arches. Activities that could contribute to shin splints include:  A sudden increase in exercise time.  Starting a new, demanding activity.  Running up hills or long distances.  Playing sports with sudden starts and stops.  A poor warm up.  Old or worn-out shoes. SYMPTOMS   Pain on the front of the leg.  Pain while exercising or at rest. DIAGNOSIS  Your caregiver will diagnose shin splints from a history of your symptoms and a physical exam. You may be observed as you walk or run. X-ray exams or further testing may be needed to rule out other problems, such as a stress fracture, which also causes lower leg pain. TREATMENT  Your caregiver may decide on the treatment based on your age, history, health, and how bad the pain is. Most cases of shin splints can be managed by one or more of the following:  Resting.  Reducing the length and intensity of your exercise.  Stopping the activity that causes shin pain.  Taking medicines to control the inflammation.  Icing, massaging, stretching, and strengthening the affected area.  Getting shoes with rigid heels, shock absorption, and a good arch support. HOME CARE INSTRUCTIONS   Resume activity steadily or as directed by your caregiver.  Restart your exercise sessions with non-weight-bearing exercises, such as cycling or swimming.  Stop running if the pain returns.  Warm up properly before exercising.  Run on a level and fairly firm surface.  Gradually change the intensity of an exercise.  Limit increases in running  distance by no more than 5 to 10% weekly. This means if you are running 5 miles, you can only increase your run by 1/2 a mile at a time.  Change your athletic shoes every 6 months, or every 350 to 450 miles. SEEK MEDICAL CARE IF:   Symptoms continue or worsen even after treatment.  The location, intensity, or type of pain changes over time. SEEK IMMEDIATE MEDICAL CARE IF:   You have severe pain.  You have trouble walking. MAKE SURE YOU:  Understand these instructions.  Will watch your condition.  Will get help right away if you are not doing well or get worse. Document Released: 12/11/2000 Document Revised: 03/07/2012 Document Reviewed: 05/31/2011 [employer] Patient Information 2014 [employer].  

## 2013-10-12 ENCOUNTER — Telehealth: Payer: Self-pay | Admitting: Family Medicine

## 2013-10-18 ENCOUNTER — Other Ambulatory Visit: Payer: Self-pay | Admitting: Family Medicine

## 2013-10-18 DIAGNOSIS — M79673 Pain in unspecified foot: Secondary | ICD-10-CM

## 2013-10-18 NOTE — Telephone Encounter (Signed)
Wants a referral for his feet  Really hurting him

## 2013-10-18 NOTE — Telephone Encounter (Signed)
Orthopedic referral put in

## 2013-10-20 ENCOUNTER — Telehealth: Payer: Self-pay | Admitting: Family Medicine

## 2013-10-20 NOTE — Telephone Encounter (Signed)
Pt notified referral is pending Verbalizes understanding

## 2013-10-24 ENCOUNTER — Other Ambulatory Visit: Payer: Self-pay | Admitting: Family Medicine

## 2013-10-24 NOTE — Telephone Encounter (Signed)
Bill please address 

## 2013-10-24 NOTE — Telephone Encounter (Signed)
He has an order for ortho referral for his feet

## 2013-11-02 ENCOUNTER — Other Ambulatory Visit: Payer: Self-pay

## 2014-03-14 ENCOUNTER — Ambulatory Visit (INDEPENDENT_AMBULATORY_CARE_PROVIDER_SITE_OTHER): Payer: Managed Care, Other (non HMO) | Admitting: Family Medicine

## 2014-03-14 ENCOUNTER — Encounter: Payer: Self-pay | Admitting: Family Medicine

## 2014-03-14 VITALS — BP 129/85 | HR 79 | Temp 97.6°F | Ht 73.0 in | Wt 206.6 lb

## 2014-03-14 DIAGNOSIS — G2581 Restless legs syndrome: Secondary | ICD-10-CM

## 2014-03-14 MED ORDER — CARBIDOPA-LEVODOPA ER 50-200 MG PO TBCR: EXTENDED_RELEASE_TABLET | ORAL | Status: DC

## 2014-03-14 NOTE — Progress Notes (Signed)
   Subjective:    Patient ID: Madilyn Fireman, male    DOB: [birth year], 38 y.o.   MRN: [id number]  HPI This 38 y.o. male presents for evaluation of bilateral plantar fascitis.  He is taking Some NSAIDS ordered by his ortho.  He gets injections and tx for plantar fascitis by Ortho.  He has been having cramps at HS and RLS sx's.   Review of Systems C/o RLS sx's   No chest pain, SOB, HA, dizziness, vision change, N/V, diarrhea, constipation, dysuria, urinary urgency or frequency, myalgias, arthralgias or rash.  Objective:   Physical Exam Vital signs noted  Well developed well nourished male.  HEENT - Head atraumatic Normocephalic                Eyes - PERRLA, Conjuctiva - clear Sclera- Clear EOMI                Ears - EAC's Wnl TM's Wnl Gross Hearing WNL                Nose - Nares patent                 Throat - oropharanx wnl Respiratory - Lungs CTA bilateral Cardiac - RRR S1 and S2 without murmur GI - Abdomen soft Nontender and bowel sounds active x 4 Extremities - No edema. Neuro - Grossly intact.       Assessment & Plan:  RLS (restless legs syndrome) - Plan: carbidopa-levodopa (SINEMET CR) 50-200 MG per tablet follow up prn  Plantar Fascitis - Follow up with Orthopedic and continue rx'd anti-inflammatory medicine.  Lysbeth Penner FNP

## 2014-04-10 ENCOUNTER — Other Ambulatory Visit: Payer: Self-pay | Admitting: Nurse Practitioner

## 2014-04-10 ENCOUNTER — Encounter: Payer: Self-pay | Admitting: Nurse Practitioner

## 2014-04-10 ENCOUNTER — Ambulatory Visit (INDEPENDENT_AMBULATORY_CARE_PROVIDER_SITE_OTHER): Payer: Managed Care, Other (non HMO) | Admitting: Nurse Practitioner

## 2014-04-10 ENCOUNTER — Ambulatory Visit (HOSPITAL_COMMUNITY)
Admission: RE | Admit: 2014-04-10 | Discharge: 2014-04-10 | Disposition: A | Discharge status: Home / Self Care | Payer: Managed Care, Other (non HMO) | Source: Ambulatory Visit | Attending: Nurse Practitioner | Admitting: Nurse Practitioner

## 2014-04-10 VITALS — BP 130/85 | HR 108 | Temp 98.2°F | Ht 73.0 in | Wt 204.0 lb

## 2014-04-10 DIAGNOSIS — I861 Scrotal varices: Secondary | ICD-10-CM | POA: Insufficient documentation

## 2014-04-10 DIAGNOSIS — N433 Hydrocele, unspecified: Secondary | ICD-10-CM | POA: Insufficient documentation

## 2014-04-10 DIAGNOSIS — N50812 Left testicular pain: Secondary | ICD-10-CM

## 2014-04-10 DIAGNOSIS — N509 Disorder of male genital organs, unspecified: Secondary | ICD-10-CM | POA: Insufficient documentation

## 2014-04-10 MED ORDER — CIPROFLOXACIN HCL 500 MG PO TABS: 500.0000 mg | ORAL_TABLET | Freq: Two times a day (BID) | ORAL | Status: DC

## 2014-04-10 NOTE — Patient Instructions (Signed)
Epididymitis  Epididymitis is a swelling (inflammation) of the epididymis. The epididymis is a cord-like structure along the back part of the testicle. Epididymitis is usually, but not always, caused by infection. This is usually a sudden problem beginning with chills, fever and pain behind the scrotum and in the testicle. There may be swelling and redness of the testicle.  DIAGNOSIS   Physical examination will reveal a tender, swollen epididymis. Sometimes, cultures are obtained from the urine or from prostate secretions to help find out if there is an infection or if the cause is a different problem. Sometimes, blood work is performed to see if your white blood cell count is elevated and if a germ (bacterial) or viral infection is present. Using this knowledge, an appropriate medicine which kills germs (antibiotic) can be chosen by your caregiver. A viral infection causing epididymitis will most often go away (resolve) without treatment.  HOME CARE INSTRUCTIONS   · Hot sitz baths for 20 minutes, 4 times per day, may help relieve pain.  · Only take over-the-counter or prescription medicines for pain, discomfort or fever as directed by your caregiver.  · Take all medicines, including antibiotics, as directed. Take the antibiotics for the full prescribed length of time even if you are feeling better.  · It is very important to keep all follow-up appointments.  SEEK IMMEDIATE MEDICAL CARE IF:   · You have a fever.  · You have pain not relieved with medicines.  · You have any worsening of your problems.  · Your pain seems to come and go.  · You develop pain, redness, and swelling in the scrotum and surrounding areas.  MAKE SURE YOU:   · Understand these instructions.  · Will watch your condition.  · Will get help right away if you are not doing well or get worse.  Document Released: 12/11/2000 Document Revised: 03/07/2012 Document Reviewed: 10/31/2009  [employer]® Patient Information ©2014 [employer].

## 2014-04-10 NOTE — Progress Notes (Signed)
   Subjective:    Patient ID: James Blackburn, male    DOB: 10/28/1976, 38 y.o.   MRN: [id number]  HPI Patient in c/o left testicular pain- started yesterday morning- rates pain 1-2 currently but touching it increases it to 9/10.   Review of Systems  Constitutional: Negative for fever, chills and appetite change.  Respiratory: Negative.   Cardiovascular: Negative.   Genitourinary: Positive for testicular pain. Negative for dysuria, urgency and frequency.  All other systems reviewed and are negative.      Objective:   Physical Exam  Constitutional: He is oriented to person, place, and time. He appears well-developed and well-nourished.  Cardiovascular: Normal rate and normal heart sounds.   Pulmonary/Chest: Effort normal and breath sounds normal.  Genitourinary:  Left testicular pain on palpation- no edema  Neurological: He is alert and oriented to person, place, and time.  Skin: Skin is warm and dry.  Psychiatric: He has a normal mood and affect. His behavior is normal. Judgment and thought content normal.   BP 130/85  Pulse 108  Temp(Src) 98.2 F (36.8 C) (Oral)  Ht 6\' 1"  (1.854 m)  Wt 204 lb (92.534 kg)  BMI 26.92 kg/m2        Assessment & Plan:   1. Testicular pain, left   2. Epididymitis  Meds ordered this encounter  Medications  . ciprofloxacin (CIPRO) 500 MG tablet    Sig: Take 1 tablet (500 mg total) by mouth 2 (two) times daily.    Dispense:  20 tablet    Refill:  0    Order Specific Question:  Supervising Provider    Answer:  Chipper Herb [[id number]]   Orders Placed This Encounter  Procedures  . US Scrotum    Standing Status: Future     Number of Occurrences:      Standing Expiration Date: 06/11/2015    Order Specific Question:  Reason for Exam (SYMPTOM  OR DIAGNOSIS REQUIRED)    Answer:  left testicular pain    Order Specific Question:  Preferred imaging location?    Answer:  [facility]   Wear good support motirn or tylenol OTC RTO  prn  Mary-Margaret Hassell Done, [place]

## 2014-05-08 ENCOUNTER — Other Ambulatory Visit: Payer: Self-pay

## 2014-05-08 DIAGNOSIS — I10 Essential (primary) hypertension: Secondary | ICD-10-CM

## 2014-05-08 MED ORDER — LISINOPRIL 20 MG PO TABS: 20.0000 mg | ORAL_TABLET | Freq: Every day | ORAL | Status: DC

## 2014-05-08 MED ORDER — ATORVASTATIN CALCIUM 40 MG PO TABS: 40.0000 mg | ORAL_TABLET | Freq: Every day | ORAL | Status: DC

## 2014-06-11 ENCOUNTER — Telehealth: Payer: Self-pay | Admitting: Family Medicine

## 2014-06-18 NOTE — Telephone Encounter (Signed)
Message left and pt did not call back- may call if appt still needed

## 2014-07-09 ENCOUNTER — Encounter: Payer: Self-pay | Admitting: *Deleted

## 2014-07-09 ENCOUNTER — Ambulatory Visit (INDEPENDENT_AMBULATORY_CARE_PROVIDER_SITE_OTHER): Payer: Managed Care, Other (non HMO) | Admitting: Family Medicine

## 2014-07-09 ENCOUNTER — Encounter: Payer: Self-pay | Admitting: Family Medicine

## 2014-07-09 ENCOUNTER — Telehealth: Payer: Self-pay | Admitting: Family Medicine

## 2014-07-09 ENCOUNTER — Ambulatory Visit (INDEPENDENT_AMBULATORY_CARE_PROVIDER_SITE_OTHER): Payer: Managed Care, Other (non HMO)

## 2014-07-09 VITALS — BP 118/78 | HR 79 | Temp 98.9°F | Ht 73.0 in | Wt 204.0 lb

## 2014-07-09 DIAGNOSIS — R109 Unspecified abdominal pain: Secondary | ICD-10-CM

## 2014-07-09 DIAGNOSIS — R52 Pain, unspecified: Secondary | ICD-10-CM

## 2014-07-09 LAB — POCT URINALYSIS DIPSTICK
Bilirubin, UA: NEGATIVE
Blood, UA: NEGATIVE
Glucose, UA: NEGATIVE
Ketones, UA: NEGATIVE
Leukocytes, UA: NEGATIVE
Nitrite, UA: NEGATIVE
Spec Grav, UA: 1.025
Urobilinogen, UA: NEGATIVE
pH, UA: 6

## 2014-07-09 LAB — POCT UA - MICROSCOPIC ONLY
Casts, Ur, LPF, POC: NEGATIVE
Crystals, Ur, HPF, POC: NEGATIVE
Yeast, UA: NEGATIVE

## 2014-07-09 MED ORDER — TAMSULOSIN HCL 0.4 MG PO CAPS: 0.4000 mg | ORAL_CAPSULE | Freq: Every day | ORAL | Status: DC

## 2014-07-09 MED ORDER — HYDROCODONE-ACETAMINOPHEN 5-325 MG PO TABS: 1.0000 | ORAL_TABLET | Freq: Four times a day (QID) | ORAL | Status: DC | PRN

## 2014-07-09 NOTE — Telephone Encounter (Signed)
appt given for 6:30 but patient is going to go ahead and come in

## 2014-07-09 NOTE — Progress Notes (Signed)
   Subjective:    Patient ID: James Blackburn, male    DOB: Apr 02, 1976, 38 y.o.   MRN: [id number]  HPI  This 38 y.o. male presents for evaluation of left flank and left abdominal pain consistent with the Discomfort he had when passing kidney stone.  He is able to void and the discomfort is colicky.  Review of Systems C/o back and abdominal pain No chest pain, SOB, HA, dizziness, vision change, N/V, diarrhea, constipation, dysuria, urinary urgency or frequency, myalgias, arthralgias or rash.     Objective:   Physical Exam  Vital signs noted  Well developed well nourished male.  HEENT - Head atraumatic Normocephalic Respiratory - Lungs CTA bilateral Cardiac - RRR S1 and S2 without murmur GI - Abdomen soft Nontender and bowel sounds active x 4   Results for orders placed in visit on 07/09/14  POCT URINALYSIS DIPSTICK      Result Value Ref Range   Color, UA gold     Clarity, UA clear     Glucose, UA neg     Bilirubin, UA neg     Ketones, UA neg     Spec Grav, UA 1.025     Blood, UA neg     pH, UA 6.0     Protein, UA trace     Urobilinogen, UA negative     Nitrite, UA neg     Leukocytes, UA Negative    POCT UA - MICROSCOPIC ONLY      Result Value Ref Range   WBC, Ur, HPF, POC occ     RBC, urine, microscopic rare     Bacteria, U Microscopic occ     Mucus, UA occ     Epithelial cells, urine per micros occ     Crystals, Ur, HPF, POC neg     Casts, Ur, LPF, POC neg     Yeast, UA neg        Assessment & Plan:  Acute left flank pain - Plan: POCT urinalysis dipstick, POCT UA - Microscopic Only, DG Abd 1 View, tamsulosin (FLOMAX) 0.4 MG CAPS capsule, HYDROcodone-acetaminophen (NORCO) 5-325 MG per tablet.  Push po fluids and follow up in 3 days if not passed stone  Lysbeth Penner FNP

## 2014-07-10 ENCOUNTER — Encounter: Payer: Self-pay | Admitting: *Deleted

## 2014-08-13 ENCOUNTER — Ambulatory Visit (INDEPENDENT_AMBULATORY_CARE_PROVIDER_SITE_OTHER): Payer: Managed Care, Other (non HMO) | Admitting: Nurse Practitioner

## 2014-08-13 ENCOUNTER — Encounter: Payer: Self-pay | Admitting: Nurse Practitioner

## 2014-08-13 VITALS — BP 133/83 | HR 106 | Temp 98.2°F | Ht 73.0 in | Wt 200.0 lb

## 2014-08-13 DIAGNOSIS — R200 Anesthesia of skin: Secondary | ICD-10-CM

## 2014-08-13 DIAGNOSIS — R209 Unspecified disturbances of skin sensation: Secondary | ICD-10-CM

## 2014-08-13 DIAGNOSIS — M79609 Pain in unspecified limb: Secondary | ICD-10-CM

## 2014-08-13 DIAGNOSIS — M79672 Pain in left foot: Principal | ICD-10-CM

## 2014-08-13 DIAGNOSIS — M79671 Pain in right foot: Secondary | ICD-10-CM

## 2014-08-13 MED ORDER — DICLOFENAC SODIUM 75 MG PO TBEC: 75.0000 mg | DELAYED_RELEASE_TABLET | Freq: Two times a day (BID) | ORAL | Status: DC

## 2014-08-13 NOTE — Progress Notes (Signed)
   Subjective:    Patient ID: James Blackburn, male    DOB: 1976/02/24, 38 y.o.   MRN: [id number]  HPI Patient in today c/o numbness bil lower ext-Has bee coming and going for months- has history of plantar fasciitis and has had several cortisone shots in both feet. Patient says that the numbness starts in his feet and radiates  to toes then up legs. Denies numbness or tingling in hands. This feeling comes and goes. Starts out as a pain then goes numb. Was so bad at work today that he had to leave work. Has seen Dr. Rosamaria Lints and he keeps telling him that it will go away.    Review of Systems  Constitutional: Negative.   HENT: Negative.   Respiratory: Negative.   Cardiovascular: Negative.   Genitourinary: Negative.   Neurological: Positive for numbness. Negative for weakness.  Psychiatric/Behavioral: Negative.   All other systems reviewed and are negative.      Objective:   Physical Exam  Constitutional: He is oriented to person, place, and time. He appears well-developed and well-nourished.  Cardiovascular: Normal rate, regular rhythm and normal heart sounds.   Pulmonary/Chest: Effort normal and breath sounds normal.  Neurological: He is alert and oriented to person, place, and time. He has normal reflexes. No cranial nerve deficit.  Positive monofilament test on feet and up legs.  Skin: Skin is warm and dry.  Psychiatric: He has a normal mood and affect. His behavior is normal. Judgment and thought content normal.   BP 133/83  Pulse 106  Temp(Src) 98.2 F (36.8 C) (Oral)  Ht 6\' 1"  (1.854 m)  Wt 200 lb (90.719 kg)  BMI 26.39 kg/m2       Assessment & Plan:   1. Heel pain, bilateral   2. Numbness of lower extremity    Orders Placed This Encounter  Procedures  . Ambulatory referral to Neurology    Referral Priority:  Routine    Referral Type:  Consultation    Referral Reason:  Specialty Services Required    Requested Specialty:  Neurology    Number of Visits  Requested:  1   Meds ordered this encounter  Medications  . diclofenac (VOLTAREN) 75 MG EC tablet    Sig: Take 1 tablet (75 mg total) by mouth 2 (two) times daily.    Dispense:  60 tablet    Refill:  2    Order Specific Question:  Supervising Provider    Answer:  Joycelyn Man   frezze a little of water and role foot over it for 10 minutes each foot BID Will wait on neuro referral for numbness RTO prn  Mary-Margaret Hassell Done, FNP

## 2014-08-13 NOTE — Patient Instructions (Signed)
Plantar Fasciitis  Plantar fasciitis is a common condition that causes foot pain. It is soreness (inflammation) of the band of tough fibrous tissue on the bottom of the foot that runs from the heel bone (calcaneus) to the ball of the foot. The cause of this soreness may be from excessive standing, poor fitting shoes, running on hard surfaces, being overweight, having an abnormal walk, or overuse (this is common in runners) of the painful foot or feet. It is also common in aerobic exercise dancers and ballet dancers.  SYMPTOMS   Most people with plantar fasciitis complain of:   Severe pain in the morning on the bottom of their foot especially when taking the first steps out of bed. This pain recedes after a few minutes of walking.   Severe pain is experienced also during walking following a long period of inactivity.   Pain is worse when walking barefoot or up stairs  DIAGNOSIS    Your caregiver will diagnose this condition by examining and feeling your foot.   Special tests such as X-rays of your foot, are usually not needed.  PREVENTION    Consult a sports medicine professional before beginning a new exercise program.   Walking programs offer a good workout. With walking there is a lower chance of overuse injuries common to runners. There is less impact and less jarring of the joints.   Begin all new exercise programs slowly. If problems or pain develop, decrease the amount of time or distance until you are at a comfortable level.   Wear good shoes and replace them regularly.   Stretch your foot and the heel cords at the back of the ankle (Achilles tendon) both before and after exercise.   Run or exercise on even surfaces that are not hard. For example, asphalt is better than pavement.   Do not run barefoot on hard surfaces.   If using a treadmill, vary the incline.   Do not continue to workout if you have foot or joint problems. Seek professional help if they do not improve.  HOME CARE INSTRUCTIONS     Avoid activities that cause you pain until you recover.   Use ice or cold packs on the problem or painful areas after working out.   Only take over-the-counter or prescription medicines for pain, discomfort, or fever as directed by your caregiver.   Soft shoe inserts or athletic shoes with air or gel sole cushions may be helpful.   If problems continue or become more severe, consult a sports medicine caregiver or your own health care provider. Cortisone is a potent anti-inflammatory medication that may be injected into the painful area. You can discuss this treatment with your caregiver.  MAKE SURE YOU:    Understand these instructions.   Will watch your condition.   Will get help right away if you are not doing well or get worse.  Document Released: 09/08/2001 Document Revised: 03/07/2012 Document Reviewed: 11/07/2008  Exit[employer] Patient Information 2015 [employer]. This information is not intended to replace advice given to you by your health care provider. Make sure you discuss any questions you have with your health care provider.

## 2014-08-27 ENCOUNTER — Other Ambulatory Visit: Payer: Self-pay | Admitting: Family Medicine

## 2014-09-28 ENCOUNTER — Encounter: Payer: Self-pay | Admitting: Neurology

## 2014-09-28 ENCOUNTER — Ambulatory Visit (INDEPENDENT_AMBULATORY_CARE_PROVIDER_SITE_OTHER): Payer: Managed Care, Other (non HMO) | Admitting: Neurology

## 2014-09-28 VITALS — BP 124/80 | HR 76 | Ht 73.0 in | Wt 200.5 lb

## 2014-09-28 DIAGNOSIS — G5762 Lesion of plantar nerve, left lower limb: Secondary | ICD-10-CM

## 2014-09-28 MED ORDER — GABAPENTIN 300 MG PO CAPS: ORAL_CAPSULE | ORAL | Status: DC

## 2014-09-28 NOTE — Patient Instructions (Signed)
1.  Start taking neurontin 300mg  one tablet at bedtime x 1 week, if tolerating, increase to one tablet twice daily. 2.  Referral to podiatry for possible Morton's neuroma 3.  EMG of the left leg 4.  Return to clinic 31-months

## 2014-09-28 NOTE — Progress Notes (Signed)
[facility] Neu[facility] Clinic Note - Initial Visit   Date: 09/28/2014  James Blackburn MRN: [id number] DOB: [birth year]   Dear Dr. Christell Constant:  Thank you for your kind referral of James Blackburn for consultation of burning pain of the feet. Although his history is well known to you, please allow [place] to reiterate it for the purpose of our medical record. The patient was accompanied to the clinic by self.    History of Present Illness: James Blackburn is a 38 y.o. left-handed Caucasian male with history of hypertension, hyperlipidemia,  presenting for evaluation of bilateral feet pain.    Starting around August, he developed numbness/tingling of the toes, worse over the first three toes on the left.  He has a history of plantar fasciitis which started one year ago and has had several cortisone shots in both feet.  He has occasional burning sensation over the dorsum of the foot which radiates in between the first two toes.  Denies any exacerbating or alleviating factors.  He has tried soaking his feet in hot water and ibuprofen which helps somewhat.  Denies wearing tight compressive shoes.  No associated falls.  He has been seeing Dr. Cathlean Cower who said that he may have nerve injury, but should improve.  He is referred by his PCP for further evaluation.   Past Medical History  Diagnosis Date  . Anal fissure 2008    per Dr Day  . Hypertension   . Hyperlipidemia   . Chronic kidney disease   . Renal stone   . H/O hiatal hernia     Past Surgical History  Procedure Laterality Date  . Vasectomy       Medications:  Current Outpatient Prescriptions on File Prior to Visit  Medication Sig Dispense Refill  . atorvastatin (LIPITOR) 40 MG tablet TAKE 1 TABLET(S) BY MOUTH DAILY  90 tablet  0  . lisinopril (PRINIVIL,ZESTRIL) 20 MG tablet TAKE 1 TABLET(S) BY MOUTH DAILY  90 tablet  0   Current Facility-Administered Medications on File Prior to Visit  Medication Dose  Route Frequency Provider Last Rate Last Dose  . hydrOXYzine (ATARAX/VISTARIL) tablet 25 mg  25 mg Oral Q4H PRN Deatra Canter, FNP        Allergies:  Allergies  Allergen Reactions  . Amoxicillin Other (See Comments)    Breathing difficulty  . Entex T [Pseudoephedrine-Guaifenesin] Other (See Comments)    "Makes my legs ache"  . Fenofibrate Other (See Comments)    myalgias    Family History: Family History  Problem Relation Age of Onset  . Colon cancer Neg Hx     Social History: History   Social History  . Marital Status: Married    Spouse Name: N/A    Number of Children: 2  . Years of Education: N/A   Occupational History  . [employer]   Social History Main Topics  . Smoking status: Never Smoker   . Smokeless tobacco: Never Used  . Alcohol Use: No  . Drug Use: No  . Sexual Activity: Not on file   Other Topics Concern  . Not on file   Social History Narrative   3 caffeine drinks daily    Lives with wife and children in one story home.   High school education.   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.    Review of Systems:  CONSTITUTIONAL: No fevers, chills, night sweats, or weight loss.   EYES: No visual changes or eye pain ENT: No  hearing changes.  No history of nose bleeds.   RESPIRATORY: No cough, wheezing and shortness of breath.   CARDIOVASCULAR: Negative for chest pain, and palpitations.   GI: Negative for abdominal discomfort, blood in stools or black stools.  No recent change in bowel habits.   GU:  No history of incontinence.   MUSCLOSKELETAL: No history of joint pain or swelling.  No myalgias.   SKIN: Negative for lesions, rash, and itching.   HEMATOLOGY/ONCOLOGY: Negative for prolonged bleeding, bruising easily, and swollen nodes.  No history of cancer.   ENDOCRINE: Negative for cold or heat intolerance, polydipsia or goiter.   PSYCH:  Nodepression or anxiety symptoms.   NEURO: As Above.   Vital Signs:  BP 124/80  Pulse 76  Ht  6\' 1"  (1.854 m)  Wt 200 lb 8 oz (90.946 kg)  BMI 26.46 kg/m2  SpO2 97%   General Medical Exam:   General:  Well appearing, comfortable.   Eyes/ENT: see cranial nerve examination.   Neck: No masses appreciated.  Full range of motion without tenderness.  No carotid bruits. Respiratory:  Clear to auscultation, good air entry bilaterally.   Cardiac:  Regular rate and rhythm, no murmur.   Extremities:  No deformities, edema, or skin discoloration. Good capillary refill.   Skin:  Skin color, texture, turgor normal. No rashes or lesions.  Neurological Exam: MENTAL STATUS including orientation to time, place, person, recent and remote memory, attention span and concentration, language, and fund of knowledge is normal.  Speech is not dysarthric.  CRANIAL NERVES: II:  No visual field defects.  Unremarkable fundi.   III-IV-VI: Pupils equal round and reactive to light.  Normal conjugate, extra-ocular eye movements in all directions of gaze.  No nystagmus.  No ptosis.   V:  Normal facial sensation.     VII:  Normal facial symmetry and movements.    VIII:  Normal hearing and vestibular function.   IX-X:  Normal palatal movement.   XI:  Normal shoulder shrug and head rotation.   XII:  Normal tongue strength and range of motion, no deviation or fasciculation.  MOTOR:  No atrophy, fasciculations or abnormal movements.  No pronator drift.  Tone is normal.    Right Upper Extremity:    Left Upper Extremity:    Deltoid  5/5   Deltoid  5/5   Biceps  5/5   Biceps  5/5   Triceps  5/5   Triceps  5/5   Wrist extensors  5/5   Wrist extensors  5/5   Wrist flexors  5/5   Wrist flexors  5/5   Finger extensors  5/5   Finger extensors  5/5   Finger flexors  5/5   Finger flexors  5/5   Dorsal interossei  5/5   Dorsal interossei  5/5   Abductor pollicis  5/5   Abductor pollicis  5/5   Tone (Ashworth scale)  0  Tone (Ashworth scale)  0   Right Lower Extremity:    Left Lower Extremity:    Hip flexors  5/5    Hip flexors  5/5   Hip extensors  5/5   Hip extensors  5/5   Knee flexors  5/5   Knee flexors  5/5   Knee extensors  5/5   Knee extensors  5/5   Dorsiflexors  5/5   Dorsiflexors  5/5   Plantarflexors  5/5   Plantarflexors  5/5   Toe extensors  5/5   Toe extensors  5/5  Toe flexors  5/5   Toe flexors  5/5   Tone (Ashworth scale)  0  Tone (Ashworth scale)  0   MSRs:  Right                                                                 Left brachioradialis 2+  brachioradialis 2+  biceps 2+  biceps 2+  triceps 2+  triceps 2+  patellar 2+  patellar 2+  ankle jerk 2+  ankle jerk 2+  Hoffman no  Hoffman no  plantar response down  plantar response down   SENSORY:  Vibration reduced at the great toe bilaterally, worse on the left, otherwise normal and symmetric perception of light touch, pinprick, vibration, and proprioception.  Romberg's sign absent.   COORDINATION/GAIT: Normal finger-to- nose-finger and heel-to-shin.  Intact rapid alternating movements bilaterally.  Able to rise from a chair without using arms.  Gait narrow based and stable. Tandem and stressed gait intact.    IMPRESSION: Mr. Wiencek is a 38 year-old gentleman presenting with one-month history of left>right foot pain and numbness, especially over the first three toes.  Neurological exam is notable for mildly diminished vibration at the great toe bilaterally and otherwise normal.  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 With his mostly normal exam, I do not feel that he has neuropathy affecting the legs, but NCS/EMG can be performed to better localize the nature of his symptoms.   PLAN/RECOMMENDATIONS:  1.  Referral to podiatry for possible Morton's neuroma 2.  Start neurontin 300mg  BID for paresthetias 3.  NCS/EMG of the left > right leg 4.  Return to clinic in 3 months.   The duration of this appointment visit was 40 minutes of face-to-face time with the  patient.  Greater than 50% of this time was spent in counseling, explanation of diagnosis, planning of further management, and coordination of care.   Thank you for allowing me to participate in patient's care.  If I can answer any additional questions, I would be pleased to do so.    Sincerely,    Tytan Sandate K. Allena Katz, DO

## 2014-10-09 ENCOUNTER — Ambulatory Visit (INDEPENDENT_AMBULATORY_CARE_PROVIDER_SITE_OTHER): Payer: Managed Care, Other (non HMO) | Admitting: Podiatry

## 2014-10-09 ENCOUNTER — Encounter: Payer: Self-pay | Admitting: Podiatry

## 2014-10-09 ENCOUNTER — Ambulatory Visit (INDEPENDENT_AMBULATORY_CARE_PROVIDER_SITE_OTHER): Payer: Managed Care, Other (non HMO)

## 2014-10-09 VITALS — BP 143/88 | HR 79 | Resp 16 | Ht 73.0 in | Wt 200.0 lb

## 2014-10-09 DIAGNOSIS — M722 Plantar fascial fibromatosis: Secondary | ICD-10-CM

## 2014-10-09 DIAGNOSIS — M779 Enthesopathy, unspecified: Secondary | ICD-10-CM

## 2014-10-09 DIAGNOSIS — M79673 Pain in unspecified foot: Secondary | ICD-10-CM

## 2014-10-09 MED ORDER — TRIAMCINOLONE ACETONIDE 10 MG/ML IJ SUSP
10.0000 mg | Freq: Once | INTRAMUSCULAR | Status: AC
  Administered 2014-10-09: 10 mg

## 2014-10-09 MED ORDER — DICLOFENAC SODIUM 75 MG PO TBEC: 75.0000 mg | DELAYED_RELEASE_TABLET | Freq: Two times a day (BID) | ORAL | Status: DC

## 2014-10-09 NOTE — Progress Notes (Signed)
Subjective:     Patient ID: James Blackburn, male   DOB: [birth year], 38 y.o.   MRN: [id number]  Foot Pain   patient states that he has had heel pain on both heels with his right being worse at this time. Also states he's having pain in his left forefoot that worse after he is been on it for a while and that the left heel no longer hurts like it did and he has had several cortisone injections in both heels up to this point. Referred by neurologist   Review of Systems  All other systems reviewed and are negative.      Objective:   Physical Exam  Nursing note and vitals reviewed. Constitutional: He is oriented to person, place, and time.  Cardiovascular: Intact distal pulses.   Musculoskeletal: Normal range of motion.  Neurological: He is oriented to person, place, and time.  Skin: Skin is warm.   neurovascular status found to be intact with muscle strength adequate and range of motion of subtalar and midtarsal joint within normal limits. Patient is noted to have exquisite discomfort on the plantar heel right at the insertion of the tendon into the calcaneus and mild forefoot pain around the second metatarsophalangeal joint left with negative Mulder's sign. Patient's digits are well-perfused patient well oriented x3 with moderate depression of the arch upon weightbearing     Assessment:     Continue plan her fasciitis right of the heel with probable inflammatory capsulitis left secondary to compensation from previous healed    Plan:     H&P and x-rays reviewed with patient. Injected the right plantar fascia 3 mg Kenalog 5 mg Xylocaine and applied fascially brace. Applied thick plantar pad with low dye taping to the left forefoot and placed on diclofenac 75 mg twice a day. Reappoint to recheck again in 1 week

## 2014-10-09 NOTE — Progress Notes (Signed)
   Subjective:    Patient ID: James Blackburn, male    DOB: 27-Aug-1976, 38 y.o.   MRN: [id number]  HPI Comments: "I have pain in my feet"  Patient c/o numbness, burning forefoot/toes bilateral, left over right, for few months. He has seen PCP and neurologist. He's had injections for plantar fasciitis bilateral. He has also seen Dr. Rosamaria Lints and said he may have nerve injury. He's tried soaking in hot water and Ibuprofen-some relief. Neurologist seems to think he may have a neuroma.  Also having trouble still with right heel.     Review of Systems  Neurological: Positive for numbness.  All other systems reviewed and are negative.      Objective:   Physical Exam        Assessment & Plan:

## 2014-10-09 NOTE — Patient Instructions (Signed)

## 2014-10-17 ENCOUNTER — Ambulatory Visit (INDEPENDENT_AMBULATORY_CARE_PROVIDER_SITE_OTHER): Payer: Managed Care, Other (non HMO) | Admitting: Podiatry

## 2014-10-17 ENCOUNTER — Encounter: Payer: Self-pay | Admitting: Podiatry

## 2014-10-17 VITALS — BP 123/87 | HR 80 | Resp 16

## 2014-10-17 DIAGNOSIS — M779 Enthesopathy, unspecified: Secondary | ICD-10-CM

## 2014-10-17 DIAGNOSIS — M722 Plantar fascial fibromatosis: Secondary | ICD-10-CM

## 2014-10-18 NOTE — Progress Notes (Signed)
Subjective:     Patient ID: James Blackburn, male   DOB: 1976/03/08, 38 y.o.   MRN: [id number]  HPI patient presents stating he's doing quite a bit better after having treatment last week. He has had this problem for a long time so I am not quite sure whether we've achieved full resolution   Review of Systems     Objective:   Physical Exam  Neurovascular status intact with muscle strength adequate range of motion within normal limits. Significant diminishment of discomfort plantar heel right and forefoot left with mild pain upon deep palpation but significantly improved    Assessment:     Long-term history of structural problems with improvement secondary to medication and padding    Plan:     Reviewed condition and discussed chronic tight fascia and I do not expect with gotten complete resolution of his symptoms. At this time I went ahead and I scanned for custom orthotics to reduce stress against the bottom of his feet

## 2014-11-09 ENCOUNTER — Telehealth: Payer: Self-pay | Admitting: Neurology

## 2014-11-09 NOTE — Telephone Encounter (Signed)
Pt canceled the emg for 12-24-14 and the follow up appt

## 2014-11-27 ENCOUNTER — Ambulatory Visit (INDEPENDENT_AMBULATORY_CARE_PROVIDER_SITE_OTHER): Payer: Managed Care, Other (non HMO) | Admitting: Family Medicine

## 2014-11-27 ENCOUNTER — Encounter: Payer: Self-pay | Admitting: Family Medicine

## 2014-11-27 VITALS — BP 129/91 | HR 99 | Temp 97.1°F | Ht 73.0 in | Wt 198.8 lb

## 2014-11-27 DIAGNOSIS — N342 Other urethritis: Secondary | ICD-10-CM

## 2014-11-27 MED ORDER — CIPROFLOXACIN HCL 500 MG PO TABS: 500.0000 mg | ORAL_TABLET | Freq: Two times a day (BID) | ORAL | Status: DC

## 2014-11-27 MED ORDER — PHENAZOPYRIDINE HCL 200 MG PO TABS: 200.0000 mg | ORAL_TABLET | Freq: Three times a day (TID) | ORAL | Status: DC | PRN

## 2014-11-28 ENCOUNTER — Other Ambulatory Visit: Payer: Self-pay | Admitting: Nurse Practitioner

## 2014-11-28 NOTE — Progress Notes (Signed)
   Subjective:    Patient ID: James Blackburn, male    DOB: 12/11/1976, 38 y.o.   MRN: [id number]  HPI Patient c/o urethritis and pain with voiding.  He has been scratching his penis and he has been doing this a lot.  He states he gets anxious and he scratches the tip of his penis to help reduce anxiety.  He now has tenderness, redness, and pain with voiding.  He states  Review of Systems     Objective:    BP 129/91 mmHg  Pulse 99  Temp(Src) 97.1 F (36.2 C) (Oral)  Ht 6\' 1"  (1.854 m)  Wt 198 lb 12.8 oz (90.175 kg)  BMI 26.23 kg/m2 Physical Exam   GU - Urethra is erythematous and tender w/o discharge.  Testes wnl No inguinal hernia     Assessment & Plan:     ICD-9-CM ICD-10-CM   1. Urethritis 597.80 N34.2 phenazopyridine (PYRIDIUM) 200 MG tablet     ciprofloxacin (CIPRO) 500 MG tablet     DISCONTINUED: ciprofloxacin (CIPRO) 500 MG tablet  Discussed with patient he needs to avoid excessive scratching or itching genitalia.   No Follow-up on file.  Lysbeth Penner FNP

## 2014-11-29 NOTE — Telephone Encounter (Signed)
Called patient and scheduled him a follow up appointment with MMM on 01/17/15 at 4:30 pm. Enough meds given to last until appointment.

## 2014-12-10 ENCOUNTER — Ambulatory Visit (INDEPENDENT_AMBULATORY_CARE_PROVIDER_SITE_OTHER): Payer: Managed Care, Other (non HMO) | Admitting: Family Medicine

## 2014-12-10 ENCOUNTER — Encounter: Payer: Self-pay | Admitting: Family Medicine

## 2014-12-10 VITALS — BP 135/86 | HR 101 | Temp 97.5°F | Ht 73.0 in | Wt 194.0 lb

## 2014-12-10 DIAGNOSIS — R300 Dysuria: Secondary | ICD-10-CM

## 2014-12-10 DIAGNOSIS — N342 Other urethritis: Secondary | ICD-10-CM

## 2014-12-10 DIAGNOSIS — K589 Irritable bowel syndrome without diarrhea: Secondary | ICD-10-CM

## 2014-12-10 DIAGNOSIS — N481 Balanitis: Secondary | ICD-10-CM

## 2014-12-10 LAB — POCT URINALYSIS DIPSTICK
Bilirubin, UA: NEGATIVE
Blood, UA: NEGATIVE
Glucose, UA: NEGATIVE
Ketones, UA: NEGATIVE
Leukocytes, UA: NEGATIVE
Nitrite, UA: NEGATIVE
Protein, UA: NEGATIVE
Spec Grav, UA: 1.01
Urobilinogen, UA: NEGATIVE
pH, UA: 7.5

## 2014-12-10 LAB — POCT UA - MICROSCOPIC ONLY
Bacteria, U Microscopic: NEGATIVE
Casts, Ur, LPF, POC: NEGATIVE
Crystals, Ur, HPF, POC: NEGATIVE
Mucus, UA: NEGATIVE
RBC, urine, microscopic: NEGATIVE
WBC, Ur, HPF, POC: NEGATIVE

## 2014-12-10 MED ORDER — FLUCONAZOLE 150 MG PO TABS: 150.0000 mg | ORAL_TABLET | Freq: Once | ORAL | Status: DC

## 2014-12-10 MED ORDER — HYOSCYAMINE SULFATE ER 0.375 MG PO TB12: 0.3750 mg | ORAL_TABLET | Freq: Two times a day (BID) | ORAL | Status: DC

## 2014-12-10 NOTE — Progress Notes (Signed)
   Subjective:    Patient ID: James Blackburn, male    DOB: 1976/05/31, 38 y.o.   MRN: [id number]  HPI Patient is having continued urethritis and has finished with cipro abx's. He anxious and worried.  He is having IBS sx's.  He states his wife gets BV and she has vaginal yeast infections.  Review of Systems  Constitutional: Negative for fever.  HENT: Negative for ear pain.   Eyes: Negative for discharge.  Respiratory: Negative for cough.   Cardiovascular: Negative for chest pain.  Gastrointestinal: Negative for abdominal distention.  Endocrine: Negative for polyuria.  Genitourinary: Negative for difficulty urinating.  Musculoskeletal: Negative for gait problem and neck pain.  Skin: Negative for color change and rash.  Neurological: Negative for speech difficulty and headaches.  Psychiatric/Behavioral: Negative for agitation.       Objective:    BP 135/86 mmHg  Pulse 101  Temp(Src) 97.5 F (36.4 C) (Oral)  Ht 6\' 1"  (1.854 m)  Wt 194 lb (87.998 kg)  BMI 25.60 kg/m2 Physical Exam  Constitutional: He is oriented to person, place, and time. He appears well-developed and well-nourished.  HENT:  Head: Normocephalic and atraumatic.  Mouth/Throat: Oropharynx is clear and moist.  Eyes: Pupils are equal, round, and reactive to light.  Neck: Normal range of motion. Neck supple.  Cardiovascular: Normal rate and regular rhythm.   No murmur heard. Pulmonary/Chest: Effort normal and breath sounds normal.  Abdominal: Soft. Bowel sounds are normal. There is no tenderness.  Neurological: He is alert and oriented to person, place, and time.  Skin: Skin is warm and dry.  Psychiatric: He has a normal mood and affect.          Assessment & Plan:     ICD-9-CM ICD-10-CM   1. Dysuria 788.1 R30.0 POCT UA - Microscopic Only     POCT urinalysis dipstick     GC/Chlamydia Probe Amp  2. Urethritis 597.80 N34.2 GC/Chlamydia Probe Amp  3. IBS (irritable bowel syndrome) 564.1 K58.9  hyoscyamine (LEVBID) 0.375 MG 12 hr tablet  4. Balanitis 607.1 N48.1 fluconazole (DIFLUCAN) 150 MG tablet     No Follow-up on file.  Lysbeth Penner FNP

## 2014-12-12 ENCOUNTER — Telehealth: Payer: Self-pay | Admitting: Family Medicine

## 2014-12-12 NOTE — Telephone Encounter (Signed)
Aware, waiting for provider to review.

## 2014-12-13 ENCOUNTER — Telehealth: Payer: Self-pay | Admitting: Family Medicine

## 2014-12-13 LAB — GC/CHLAMYDIA PROBE AMP
Chlamydia trachomatis, NAA: NEGATIVE
Neisseria gonorrhoeae by PCR: NEGATIVE

## 2014-12-14 ENCOUNTER — Other Ambulatory Visit: Payer: Self-pay | Admitting: Family Medicine

## 2014-12-14 NOTE — Telephone Encounter (Signed)
Reviewed results, will close encounter.

## 2014-12-14 NOTE — Telephone Encounter (Signed)
UA GC and chlamydia are normal

## 2014-12-24 ENCOUNTER — Encounter: Payer: Managed Care, Other (non HMO) | Admitting: Neurology

## 2014-12-24 ENCOUNTER — Telehealth: Payer: Self-pay | Admitting: Family Medicine

## 2014-12-25 ENCOUNTER — Other Ambulatory Visit: Payer: Self-pay | Admitting: Family Medicine

## 2014-12-25 MED ORDER — BENZONATATE 100 MG PO CAPS: 100.0000 mg | ORAL_CAPSULE | Freq: Three times a day (TID) | ORAL | Status: DC | PRN

## 2014-12-25 NOTE — Telephone Encounter (Signed)
rx of tessalon sent to his pharmacy

## 2014-12-25 NOTE — Telephone Encounter (Signed)
Aware,cough med sent to pharmacy.

## 2015-01-01 ENCOUNTER — Ambulatory Visit: Payer: Managed Care, Other (non HMO) | Admitting: Neurology

## 2015-01-04 ENCOUNTER — Ambulatory Visit: Payer: Managed Care, Other (non HMO) | Admitting: Neurology

## 2015-01-12 ENCOUNTER — Ambulatory Visit (INDEPENDENT_AMBULATORY_CARE_PROVIDER_SITE_OTHER): Payer: Managed Care, Other (non HMO) | Admitting: Nurse Practitioner

## 2015-01-12 DIAGNOSIS — I10 Essential (primary) hypertension: Secondary | ICD-10-CM

## 2015-01-12 DIAGNOSIS — E785 Hyperlipidemia, unspecified: Secondary | ICD-10-CM

## 2015-01-14 LAB — NMR, LIPOPROFILE
Cholesterol: 114 mg/dL (ref 100–199)
HDL CHOLESTEROL BY NMR: 28 mg/dL — AB (ref >39)
HDL Particle Number: 24.6 umol/L — ABNORMAL LOW (ref >=30.5)
LDL Particle Number: 884 nmol/L (ref <1000)
LDL Size: 20.2 nm (ref >20.5)
LDL-C: 63 mg/dL (ref 0–99)
LP-IR SCORE: 56 — AB (ref <=45)
Small LDL Particle Number: 530 nmol/L — ABNORMAL HIGH (ref <=527)
Triglycerides by NMR: 113 mg/dL (ref 0–149)

## 2015-01-14 LAB — CMP14+EGFR
ALBUMIN: 4.5 g/dL (ref 3.5–5.5)
ALK PHOS: 81 IU/L (ref 39–117)
ALT: 39 IU/L (ref 0–44)
AST: 28 IU/L (ref 0–40)
Albumin/Globulin Ratio: 1.7 (ref 1.1–2.5)
BILIRUBIN TOTAL: 1 mg/dL (ref 0.0–1.2)
BUN/Creatinine Ratio: 17 (ref 8–19)
BUN: 13 mg/dL (ref 6–20)
CALCIUM: 9.6 mg/dL (ref 8.7–10.2)
CO2: 23 mmol/L (ref 18–29)
Chloride: 101 mmol/L (ref 97–108)
Creatinine, Ser: 0.76 mg/dL (ref 0.76–1.27)
GFR calc Af Amer: 134 mL/min/{1.73_m2} (ref >59)
GFR, EST NON AFRICAN AMERICAN: 116 mL/min/{1.73_m2} (ref >59)
Globulin, Total: 2.6 g/dL (ref 1.5–4.5)
Glucose: 91 mg/dL (ref 65–99)
POTASSIUM: 4.4 mmol/L (ref 3.5–5.2)
SODIUM: 140 mmol/L (ref 134–144)
Total Protein: 7.1 g/dL (ref 6.0–8.5)

## 2015-01-17 ENCOUNTER — Encounter: Payer: Self-pay | Admitting: Nurse Practitioner

## 2015-01-17 ENCOUNTER — Ambulatory Visit (INDEPENDENT_AMBULATORY_CARE_PROVIDER_SITE_OTHER): Payer: Managed Care, Other (non HMO) | Admitting: Nurse Practitioner

## 2015-01-17 VITALS — BP 136/84 | HR 75 | Temp 97.1°F

## 2015-01-17 DIAGNOSIS — I10 Essential (primary) hypertension: Secondary | ICD-10-CM

## 2015-01-17 DIAGNOSIS — E785 Hyperlipidemia, unspecified: Secondary | ICD-10-CM

## 2015-01-17 MED ORDER — LISINOPRIL 20 MG PO TABS
ORAL_TABLET | ORAL | Status: DC
Start: 2015-01-17 — End: 2015-08-05

## 2015-01-17 MED ORDER — ATORVASTATIN CALCIUM 40 MG PO TABS: ORAL_TABLET | ORAL | Status: DC

## 2015-01-17 NOTE — Patient Instructions (Signed)

## 2015-01-17 NOTE — Progress Notes (Signed)
   Subjective:    Patient ID: James Blackburn, male    DOB: 06/07/1976, 39 y.o.   MRN: [id number]   Patient here today for follow up of chronic medical problems.   Hypertension This is a chronic problem. The current episode started more than 1 year ago. The problem is unchanged. The problem is controlled. Pertinent negatives include no chest pain. Risk factors for coronary artery disease include dyslipidemia, male gender and post-menopausal state. Past treatments include ACE inhibitors. The current treatment provides moderate improvement. Compliance problems include diet and exercise.   Hyperlipidemia This is a chronic problem. The current episode started more than 1 year ago. The problem is controlled. Recent lipid tests were reviewed and are normal. Pertinent negatives include no chest pain, leg pain or myalgias. Current antihyperlipidemic treatment includes statins. The current treatment provides moderate improvement of lipids. Compliance problems include adherence to diet and adherence to exercise.  Risk factors for coronary artery disease include dyslipidemia, hypertension and male sex.  neuropathy right foot due plantar fassitis Was on neurotin for a little awhile but did not seem to help - is seeing speciallist about foot numbness.   Review of Systems  Constitutional: Negative.   HENT: Negative.   Respiratory: Negative.   Cardiovascular: Negative for chest pain.  Genitourinary: Negative.   Musculoskeletal: Negative for myalgias.  Neurological: Negative.   All other systems reviewed and are negative.      Objective:   Physical Exam  Constitutional: He is oriented to person, place, and time. He appears well-developed and well-nourished.  HENT:  Head: Normocephalic.  Right Ear: External ear normal.  Left Ear: External ear normal.  Nose: Nose normal.  Mouth/Throat: Oropharynx is clear and moist.  Eyes: EOM are normal. Pupils are equal, round, and reactive to light.  Neck:  Normal range of motion. Neck supple. No JVD present. No thyromegaly present.  Cardiovascular: Normal rate, regular rhythm, normal heart sounds and intact distal pulses.  Exam reveals no gallop and no friction rub.   No murmur heard. Pulmonary/Chest: Effort normal and breath sounds normal. No respiratory distress. He has no wheezes. He has no rales. He exhibits no tenderness.  Abdominal: Soft. Bowel sounds are normal. He exhibits no mass. There is no tenderness.  Genitourinary: Prostate normal and penis normal.  Musculoskeletal: Normal range of motion. He exhibits no edema.  Lymphadenopathy:    He has no cervical adenopathy.  Neurological: He is alert and oriented to person, place, and time. No cranial nerve deficit.  Skin: Skin is warm and dry.  Psychiatric: He has a normal mood and affect. His behavior is normal. Judgment and thought content normal.   BP 136/84 mmHg  Pulse 75  Temp(Src) 97.1 F (36.2 C) (Oral)        Assessment & Plan:  1. Essential hypertension Do not add salt to diet - lisinopril (PRINIVIL,ZESTRIL) 20 MG tablet; TAKE 1 TABLET(S) BY MOUTH DAILY  Dispense: 90 tablet; Refill: 1  2. Hyperlipidemia Low fat diet - atorvastatin (LIPITOR) 40 MG tablet; TAKE 1 TABLET(S) BY MOUTH DAILY  Dispense: 90 tablet; Refill: 1    Labs results discussed at appointment Health maintenance reviewed Diet and exercise encouraged Continue all meds Follow up  In 6 months   [place], FNP

## 2015-04-08 ENCOUNTER — Ambulatory Visit (INDEPENDENT_AMBULATORY_CARE_PROVIDER_SITE_OTHER): Payer: Managed Care, Other (non HMO) | Admitting: Family

## 2015-04-08 ENCOUNTER — Encounter: Payer: Self-pay | Admitting: Family

## 2015-04-08 VITALS — BP 130/89 | HR 93 | Temp 97.0°F | Ht 73.0 in | Wt 195.0 lb

## 2015-04-08 DIAGNOSIS — H01003 Unspecified blepharitis right eye, unspecified eyelid: Secondary | ICD-10-CM | POA: Diagnosis not present

## 2015-04-08 MED ORDER — BACITRACIN 500 UNIT/GM OP OINT: 1.0000 "application " | TOPICAL_OINTMENT | Freq: Every day | OPHTHALMIC | Status: DC

## 2015-04-08 NOTE — Progress Notes (Signed)
   Subjective:    Patient ID: James Blackburn, male    DOB: Jan 05, 1976, 39 y.o.   MRN: [id number]  Eye Problem  The right eye is affected. This is a new problem. The current episode started in the past 7 days (Saturday). The problem occurs constantly. The problem has been gradually improving. There was no injury mechanism. The pain is at a severity of 3/10. The pain is mild. There is no known exposure to pink eye. He does not wear contacts. Associated symptoms include blurred vision, eye redness and itching. Pertinent negatives include no eye discharge, double vision or photophobia. Treatments tried: benadryl. The treatment provided mild relief.      Review of Systems  Constitutional: Negative.   HENT: Negative.   Eyes: Positive for blurred vision and redness. Negative for double vision, photophobia and discharge.  Respiratory: Negative.   Cardiovascular: Negative.   Gastrointestinal: Negative.   Endocrine: Negative.   Genitourinary: Negative.   Musculoskeletal: Negative.   Skin: Positive for itching.  Neurological: Negative.   Hematological: Negative.   Psychiatric/Behavioral: Negative.   All other systems reviewed and are negative.      Objective:   Physical Exam  Constitutional: He is oriented to person, place, and time. He appears well-developed and well-nourished. No distress.  HENT:  Head: Normocephalic.  Right Ear: External ear normal.  Left Ear: External ear normal.  Mouth/Throat: Oropharynx is clear and moist.  Eyes: Pupils are equal, round, and reactive to light. Right eye exhibits no discharge. Left eye exhibits no discharge.  Neck: Normal range of motion. Neck supple. No thyromegaly present.  Cardiovascular: Normal rate, regular rhythm, normal heart sounds and intact distal pulses.   No murmur heard. Pulmonary/Chest: Effort normal and breath sounds normal. No respiratory distress. He has no wheezes.  Abdominal: Soft. Bowel sounds are normal. He exhibits no  distension. There is no tenderness.  Musculoskeletal: Normal range of motion. He exhibits no edema or tenderness.  Neurological: He is alert and oriented to person, place, and time. He has normal reflexes. No cranial nerve deficit.  Skin: Skin is warm and dry. No rash noted. No erythema.  Psychiatric: He has a normal mood and affect. His behavior is normal. Judgment and thought content normal.  Vitals reviewed.    BP 130/89 mmHg  Pulse 93  Temp(Src) 97 F (36.1 C) (Oral)  Ht 6\' 1"  (1.854 m)  Wt 195 lb (88.451 kg)  BMI 25.73 kg/m2      Assessment & Plan:  1. Blepharitis, right -Good hand hygiene  -Do not rub eye -RTO prn - bacitracin ophthalmic ointment; Place 1 application into the right eye at bedtime. apply to eye  Dispense: 3.5 g; Refill: 0  Evelina Dun, FNP

## 2015-04-08 NOTE — Patient Instructions (Signed)
Blepharitis Blepharitis is redness, soreness, and swelling (inflammation) of one or both eyelids. It may be caused by an allergic reaction or a bacterial infection. Blepharitis may also be associated with reddened, scaly skin (seborrhea) of the scalp and eyebrows. While you sleep, eye discharge may cause your eyelashes to stick together. Your eyelids may itch, burn, swell, and may lose their lashes. These will grow back. Your eyes may become sensitive. Blepharitis may recur and need repeated treatment. If this is the case, you may require further evaluation by an eye specialist (ophthalmologist). HOME CARE INSTRUCTIONS   Keep your hands clean.  Use a clean towel each time you dry your eyelids. Do not use this towel to clean other areas. Do not share a towel or makeup with anyone.  Wash your eyelids with warm water or warm water mixed with a small amount of baby shampoo. Do this twice a day or as often as needed.  Wash your face and eyebrows at least once a day.  Use warm compresses 2 times a day for 10 minutes at a time, or as directed by your caregiver.  Apply antibiotic ointment as directed by your caregiver.  Avoid rubbing your eyes.  Avoid wearing makeup until you get better.  Follow up with your caregiver as directed. SEEK IMMEDIATE MEDICAL CARE IF:   You have pain, redness, or swelling that gets worse or spreads to other parts of your face.  Your vision changes, or you have pain when looking at lights or moving objects.  You have a fever.  Your symptoms continue for longer than 2 to 4 days or become worse. MAKE SURE YOU:   Understand these instructions.  Will watch your condition.  Will get help right away if you are not doing well or get worse. Document Released: 12/11/2000 Document Revised: 03/07/2012 Document Reviewed: 01/21/2011 ExitCare Patient Information 2015 [employer]. This information is not intended to replace advice given to you by your health care  provider. Make sure you discuss any questions you have with your health care provider.  

## 2015-08-05 ENCOUNTER — Other Ambulatory Visit: Payer: Self-pay | Admitting: Nurse Practitioner

## 2015-09-27 NOTE — Progress Notes (Signed)
error 

## 2015-10-14 ENCOUNTER — Encounter: Payer: Self-pay | Admitting: Family Medicine

## 2015-10-14 ENCOUNTER — Ambulatory Visit (INDEPENDENT_AMBULATORY_CARE_PROVIDER_SITE_OTHER): Payer: Managed Care, Other (non HMO) | Admitting: Family Medicine

## 2015-10-14 VITALS — BP 134/92 | HR 69 | Temp 97.4°F | Ht 73.0 in | Wt 212.4 lb

## 2015-10-14 DIAGNOSIS — E785 Hyperlipidemia, unspecified: Secondary | ICD-10-CM

## 2015-10-14 DIAGNOSIS — Z0000 Encounter for general adult medical examination without abnormal findings: Secondary | ICD-10-CM | POA: Diagnosis not present

## 2015-10-14 DIAGNOSIS — I10 Essential (primary) hypertension: Secondary | ICD-10-CM

## 2015-10-14 MED ORDER — ATORVASTATIN CALCIUM 40 MG PO TABS: 40.0000 mg | ORAL_TABLET | Freq: Every day | ORAL | Status: DC

## 2015-10-14 MED ORDER — LISINOPRIL 20 MG PO TABS: 20.0000 mg | ORAL_TABLET | Freq: Every day | ORAL | Status: DC

## 2015-10-14 NOTE — Progress Notes (Signed)
   HPI  Patient presents today for an annual physical exam.  He feels well, only complains of left midfoot pain that's aggravating and sharp in nature mostly at rest after he's been active. His previous plantar fasciitis has improved.  He does not exercise or watch his diet. He is very active at work at the [employer].  He denies chest pain, dyspnea, palpitations, leg edema.  Hyperlipidemia Good compliance Not watching diet.  Hypertension Good compliance Blood pressure at home usually 130s over 80s  PMH: Smoking status noted His past medical, surgical, social, and family history reviewed and updated in EMR ROS: Per HPI  Objective: BP 134/92 mmHg  Pulse 69  Temp(Src) 97.4 F (36.3 C) (Oral)  Ht $R'6\' 1"'in$  (1.854 m)  Wt 212 lb 6.4 oz (96.344 kg)  BMI 28.03 kg/m2 Gen: NAD, alert, cooperative with exam HEENT: NCAT, EOMI, PERRL CV: RRR, good S1/S2, no murmur Resp: CTABL, no wheezes, non-labored Abd: SNTND, BS present, no guarding or organomegaly Ext: No edema, warm Neuro: Alert and oriented, No gross deficits  Assessment and plan:  # Annual physical Fasting labs Flu shot already given  # Hypertension Slightly elevated, given log and asked to come back if consistently over 140/90 Continue lisinopril  # Hyperlipidemia Labs, continue Lipitor  #Midfoot pain Unclear etiology, if continued I recommended that he be seen [facility]   Orders Placed This Encounter  Procedures  . CMP14+EGFR  . CBC  . Lipid Panel  . TSH    Laroy Apple, MD [facility] 10/14/2015, 11:48 AM

## 2015-10-14 NOTE — Patient Instructions (Signed)
Great to meet you!  Come back in 6 months to follow up for high blood pressure unless you need [place] sooner.

## 2015-10-15 ENCOUNTER — Other Ambulatory Visit: Payer: Self-pay | Admitting: *Deleted

## 2015-10-15 DIAGNOSIS — E039 Hypothyroidism, unspecified: Secondary | ICD-10-CM

## 2015-10-15 LAB — CMP14+EGFR
ALK PHOS: 63 IU/L (ref 39–117)
ALT: 38 IU/L (ref 0–44)
AST: 27 IU/L (ref 0–40)
Albumin/Globulin Ratio: 2 (ref 1.1–2.5)
Albumin: 4.5 g/dL (ref 3.5–5.5)
BUN/Creatinine Ratio: 10 (ref 8–19)
BUN: 8 mg/dL (ref 6–20)
Bilirubin Total: 0.6 mg/dL (ref 0.0–1.2)
CO2: 23 mmol/L (ref 18–29)
CREATININE: 0.83 mg/dL (ref 0.76–1.27)
Calcium: 9.5 mg/dL (ref 8.7–10.2)
Chloride: 102 mmol/L (ref 97–106)
GFR calc Af Amer: 129 mL/min/{1.73_m2} (ref >59)
GFR calc non Af Amer: 112 mL/min/{1.73_m2} (ref >59)
GLUCOSE: 95 mg/dL (ref 65–99)
Globulin, Total: 2.3 g/dL (ref 1.5–4.5)
Potassium: 4.3 mmol/L (ref 3.5–5.2)
Sodium: 140 mmol/L (ref 136–144)
Total Protein: 6.8 g/dL (ref 6.0–8.5)

## 2015-10-15 LAB — CBC
HEMOGLOBIN: 15.6 g/dL (ref 12.6–17.7)
Hematocrit: 44.5 % (ref 37.5–51.0)
MCH: 31.9 pg (ref 26.6–33.0)
MCHC: 35.1 g/dL (ref 31.5–35.7)
MCV: 91 fL (ref 79–97)
Platelets: 268 10*3/uL (ref 150–379)
RBC: 4.89 x10E6/uL (ref 4.14–5.80)
RDW: 13.1 % (ref 12.3–15.4)
WBC: 5.3 10*3/uL (ref 3.4–10.8)

## 2015-10-15 LAB — TSH: TSH: 27.3 u[IU]/mL — ABNORMAL HIGH (ref 0.450–4.500)

## 2015-10-15 LAB — LIPID PANEL
Chol/HDL Ratio: 3.7 ratio units (ref 0.0–5.0)
Cholesterol, Total: 103 mg/dL (ref 100–199)
HDL: 28 mg/dL — AB (ref >39)
LDL Calculated: 50 mg/dL (ref 0–99)
TRIGLYCERIDES: 125 mg/dL (ref 0–149)
VLDL CHOLESTEROL CAL: 25 mg/dL (ref 5–40)

## 2015-10-16 ENCOUNTER — Other Ambulatory Visit (INDEPENDENT_AMBULATORY_CARE_PROVIDER_SITE_OTHER): Payer: Managed Care, Other (non HMO)

## 2015-10-16 DIAGNOSIS — E039 Hypothyroidism, unspecified: Secondary | ICD-10-CM

## 2015-10-16 NOTE — Progress Notes (Signed)
Lab only 

## 2015-10-18 ENCOUNTER — Other Ambulatory Visit: Payer: Self-pay | Admitting: Family Medicine

## 2015-10-18 ENCOUNTER — Telehealth: Payer: Self-pay | Admitting: Family Medicine

## 2015-10-18 DIAGNOSIS — E039 Hypothyroidism, unspecified: Secondary | ICD-10-CM | POA: Insufficient documentation

## 2015-10-18 LAB — THYROID PANEL WITH TSH
Free Thyroxine Index: 1.3 (ref 1.2–4.9)
T3 Uptake Ratio: 24 % (ref 24–39)
T4, Total: 5.3 ug/dL (ref 4.5–12.0)
TSH: 22.84 u[IU]/mL — AB (ref 0.450–4.500)

## 2015-10-18 LAB — TESTOSTERONE,FREE AND TOTAL
TESTOSTERONE FREE: 7.8 pg/mL — AB (ref 8.7–25.1)
TESTOSTERONE: 355 ng/dL (ref 348–1197)

## 2015-10-18 MED ORDER — LEVOTHYROXINE SODIUM 75 MCG PO TABS: 75.0000 ug | ORAL_TABLET | Freq: Every day | ORAL | Status: DC

## 2015-10-18 NOTE — Telephone Encounter (Signed)
Patient aware of results.

## 2015-10-18 NOTE — Telephone Encounter (Signed)
Starting synthroid for hypothyroidism, TSH 29 then 22, interestingly his T4 is normal more consistent with subclinical hypothyroidism so I chose 75 rather than 100 mcg as the starting dose.   Laroy Apple, MD [facility] 10/18/2015, 8:19 AM

## 2015-11-11 ENCOUNTER — Ambulatory Visit (INDEPENDENT_AMBULATORY_CARE_PROVIDER_SITE_OTHER): Payer: Managed Care, Other (non HMO) | Admitting: Family Medicine

## 2015-11-11 ENCOUNTER — Encounter: Payer: Self-pay | Admitting: Family Medicine

## 2015-11-11 VITALS — BP 132/86 | HR 78 | Temp 99.4°F | Ht 73.0 in | Wt 204.4 lb

## 2015-11-11 DIAGNOSIS — J019 Acute sinusitis, unspecified: Secondary | ICD-10-CM | POA: Diagnosis not present

## 2015-11-11 MED ORDER — FLUTICASONE PROPIONATE 50 MCG/ACT NA SUSP: 1.0000 | Freq: Two times a day (BID) | NASAL | Status: DC | PRN

## 2015-11-11 NOTE — Progress Notes (Signed)
BP 132/86 mmHg  Pulse 78  Temp(Src) 99.4 F (37.4 C) (Oral)  Ht 6\' 1"  (1.854 m)  Wt 204 lb 6.4 oz (92.715 kg)  BMI 26.97 kg/m2   Subjective:    Patient ID: James Blackburn, male    DOB: July 25, 1976, 40 y.o.   MRN: [id number]  HPI: James Blackburn is a 39 y.o. male presenting on 11/11/2015 for Sinusitis and Cough   HPI Sinus pressure and congestion Patient has been having sinus pressure and congestion and pressure in his ears and a frontal headache and a cough for the past 2 days. He is attempts to use Alka-Seltzer cold and Robitussin without much success. He denies any fevers or chills. He denies any shortness of breath. His cough is productive of white to clear sputum. He denies any sick contacts.  Relevant past medical, surgical, family and social history reviewed and updated as indicated. Interim medical history since our last visit reviewed. Allergies and medications reviewed and updated.  Review of Systems  Constitutional: Negative for fever and chills.  HENT: Positive for congestion, postnasal drip, rhinorrhea, sinus pressure, sneezing and sore throat. Negative for ear discharge, ear pain and voice change.   Eyes: Negative for pain, discharge, redness and visual disturbance.  Respiratory: Positive for cough. Negative for chest tightness, shortness of breath and wheezing.   Cardiovascular: Negative for chest pain and leg swelling.  Gastrointestinal: Negative for abdominal pain, diarrhea and constipation.  Genitourinary: Negative for difficulty urinating.  Musculoskeletal: Negative for back pain and gait problem.  Skin: Negative for rash.  Neurological: Negative for syncope, light-headedness and headaches.  All other systems reviewed and are negative.   Per HPI unless specifically indicated above     Medication List       This list is accurate as of: 11/11/15 12:07 PM.  Always use your most recent med list.               ALKA-SELTZER PLUS COLD & COUGH  04-28-09-325 MG Caps  Generic drug:  Phenyleph-CPM-DM-APAP  Take by mouth.     atorvastatin 40 MG tablet  Commonly known as:  LIPITOR  Take 1 tablet (40 mg total) by mouth daily.     fluticasone 50 MCG/ACT nasal spray  Commonly known as:  FLONASE  Place 1 spray into both nostrils 2 (two) times daily as needed for allergies or rhinitis.     levothyroxine 75 MCG tablet  Commonly known as:  SYNTHROID, LEVOTHROID  Take 1 tablet (75 mcg total) by mouth daily.     lisinopril 20 MG tablet  Commonly known as:  PRINIVIL,ZESTRIL  Take 1 tablet (20 mg total) by mouth daily.     Vitamin D 2000 UNITS Caps  Take by mouth.           Objective:    BP 132/86 mmHg  Pulse 78  Temp(Src) 99.4 F (37.4 C) (Oral)  Ht 6\' 1"  (1.854 m)  Wt 204 lb 6.4 oz (92.715 kg)  BMI 26.97 kg/m2  Wt Readings from Last 3 Encounters:  11/11/15 204 lb 6.4 oz (92.715 kg)  10/14/15 212 lb 6.4 oz (96.344 kg)  04/08/15 195 lb (88.451 kg)    Physical Exam  Constitutional: He is oriented to person, place, and time. He appears well-developed and well-nourished. No distress.  HENT:  Right Ear: Tympanic membrane, external ear and ear canal normal.  Left Ear: Tympanic membrane, external ear and ear canal normal.  Nose: Mucosal edema and rhinorrhea present. No sinus  tenderness. No epistaxis. Right sinus exhibits maxillary sinus tenderness. Right sinus exhibits no frontal sinus tenderness. Left sinus exhibits maxillary sinus tenderness. Left sinus exhibits no frontal sinus tenderness.  Mouth/Throat: Uvula is midline and mucous membranes are normal. Posterior oropharyngeal edema and posterior oropharyngeal erythema present. No oropharyngeal exudate or tonsillar abscesses.  Eyes: Conjunctivae and EOM are normal. Pupils are equal, round, and reactive to light. Right eye exhibits no discharge. No scleral icterus.  Cardiovascular: Normal rate, regular rhythm, normal heart sounds and intact distal pulses.   No murmur  heard. Pulmonary/Chest: Effort normal and breath sounds normal. No respiratory distress. He has no wheezes.  Abdominal: He exhibits no distension.  Musculoskeletal: Normal range of motion. He exhibits no edema.  Neurological: He is alert and oriented to person, place, and time. Coordination normal.  Skin: Skin is warm and dry. No rash noted. He is not diaphoretic.  Psychiatric: He has a normal mood and affect. His behavior is normal.  Vitals reviewed.   Results for orders placed or performed in visit on 10/16/15  Testosterone,Free and Total  Result Value Ref Range   Testosterone 355 348 - 1197 ng/dL   Comment, Testosterone Comment    Testosterone, Free 7.8 (L) 8.7 - 25.1 pg/mL  Thyroid Panel With TSH  Result Value Ref Range   TSH 22.840 (H) 0.450 - 4.500 uIU/mL   T4, Total 5.3 4.5 - 12.0 ug/dL   T3 Uptake Ratio 24 24 - 39 %   Free Thyroxine Index 1.3 1.2 - 4.9      Assessment & Plan:   Problem List Items Addressed This Visit    None    Visit Diagnoses    Acute rhinosinusitis    -  Primary    Patient will use Flonase and an antihistamine and if not improved in a few days will return.    Relevant Medications    Phenyleph-CPM-DM-APAP (ALKA-SELTZER PLUS COLD & COUGH) 04-28-09-325 MG CAPS    fluticasone (FLONASE) 50 MCG/ACT nasal spray        Follow up plan: Return if symptoms worsen or fail to improve.  Counseling provided for all of the vaccine components No orders of the defined types were placed in this encounter.    Caryl Pina, MD [facility] 11/11/2015, 12:07 PM

## 2016-03-16 ENCOUNTER — Encounter: Payer: Self-pay | Admitting: Nurse Practitioner

## 2016-03-16 ENCOUNTER — Ambulatory Visit (INDEPENDENT_AMBULATORY_CARE_PROVIDER_SITE_OTHER): Payer: Managed Care, Other (non HMO) | Admitting: Nurse Practitioner

## 2016-03-16 VITALS — BP 133/85 | HR 78 | Temp 97.6°F | Ht 73.0 in | Wt 204.0 lb

## 2016-03-16 DIAGNOSIS — J0101 Acute recurrent maxillary sinusitis: Secondary | ICD-10-CM

## 2016-03-16 MED ORDER — AZITHROMYCIN 250 MG PO TABS: ORAL_TABLET | ORAL | Status: DC

## 2016-03-16 NOTE — Progress Notes (Signed)
  Subjective:     James Blackburn is a 40 y.o. male who presents for evaluation of sinus pain. Symptoms include: cough, facial pain, nasal congestion, post nasal drip and sinus pressure. Onset of symptoms was 5 days ago. Symptoms have been gradually worsening since that time. Past history is significant for no history of pneumonia or bronchitis. Patient is a non-smoker.  The following portions of the patient's history were reviewed and updated as appropriate: allergies, current medications, past family history, past medical history, past social history, past surgical history and problem list.  Review of Systems Pertinent items are noted in HPI.   Objective:    BP 133/85 mmHg  Pulse 78  Temp(Src) 97.6 F (36.4 C) (Oral)  Ht 6\' 1"  (1.854 m)  Wt 204 lb (92.534 kg)  BMI 26.92 kg/m2 General appearance: alert and cooperative Eyes: conjunctivae/corneas clear. PERRL, EOM's intact. Fundi benign. Ears: normal TM's and external ear canals both ears Nose: clear discharge, moderate congestion, turbinates pale, sinus tenderness bilateral Throat: lips, mucosa, and tongue normal; teeth and gums normal Neck: no adenopathy, no carotid bruit, no JVD, supple, symmetrical, trachea midline and thyroid not enlarged, symmetric, no tenderness/mass/nodules Lungs: clear to auscultation bilaterally Heart: regular rate and rhythm, S1, S2 normal, no murmur, click, rub or gallop    Assessment:    Acute bacterial sinusitis.    Plan:   1. Take meds as prescribed 2. Use a cool mist humidifier especially during the winter months and when heat has been humid. 3. Use saline nose sprays frequently 4. Saline irrigations of the nose can be very helpful if done frequently.  * 4X daily for 1 week*  * Use of a nettie pot can be helpful with this. Follow directions with this* 5. Drink plenty of fluids 6. Keep thermostat turn down low 7.For any cough or congestion  Use plain Mucinex- regular strength or max strength  is fine   * Children- consult with Pharmacist for dosing 8. For fever or aces or pains- take tylenol or ibuprofen appropriate for age and weight.  * for fevers greater than 101 orally you may alternate ibuprofen and tylenol every  3 hours.   Meds ordered this encounter  Medications  . azithromycin (ZITHROMAX Z-PAK) 250 MG tablet    Sig: As directed    Dispense:  1 each    Refill:  0    Order Specific Question:  Supervising Provider    Answer:  Chipper Herb [[id number]]   [place], FNP

## 2016-03-16 NOTE — Patient Instructions (Signed)

## 2016-03-23 ENCOUNTER — Telehealth: Payer: Self-pay | Admitting: Family Medicine

## 2016-03-23 MED ORDER — OSELTAMIVIR PHOSPHATE 75 MG PO CAPS: 75.0000 mg | ORAL_CAPSULE | Freq: Every day | ORAL | Status: DC

## 2016-03-23 NOTE — Telephone Encounter (Signed)
Detailed message left for patient's wife.

## 2016-03-23 NOTE — Telephone Encounter (Signed)
Forwarding to PCP.

## 2016-03-23 NOTE — Telephone Encounter (Signed)
Rx sent for prophylactic tamiflu.   Laroy Apple, MD [facility] 03/23/2016, 12:34 PM

## 2016-10-27 ENCOUNTER — Encounter: Payer: Self-pay | Admitting: Physician Assistant

## 2016-10-27 ENCOUNTER — Ambulatory Visit (INDEPENDENT_AMBULATORY_CARE_PROVIDER_SITE_OTHER): Payer: Managed Care, Other (non HMO) | Admitting: Physician Assistant

## 2016-10-27 VITALS — BP 134/84 | HR 72 | Temp 97.0°F | Ht 73.0 in | Wt 217.4 lb

## 2016-10-27 DIAGNOSIS — E785 Hyperlipidemia, unspecified: Secondary | ICD-10-CM | POA: Diagnosis not present

## 2016-10-27 DIAGNOSIS — E039 Hypothyroidism, unspecified: Secondary | ICD-10-CM

## 2016-10-27 DIAGNOSIS — I10 Essential (primary) hypertension: Secondary | ICD-10-CM | POA: Diagnosis not present

## 2016-10-27 MED ORDER — LISINOPRIL 20 MG PO TABS: 20.0000 mg | ORAL_TABLET | Freq: Every day | ORAL | 3 refills | Status: DC

## 2016-10-27 MED ORDER — ATORVASTATIN CALCIUM 40 MG PO TABS: 40.0000 mg | ORAL_TABLET | Freq: Every day | ORAL | 3 refills | Status: DC

## 2016-10-27 MED ORDER — LEVOTHYROXINE SODIUM 75 MCG PO TABS: 75.0000 ug | ORAL_TABLET | Freq: Every day | ORAL | 3 refills | Status: DC

## 2016-10-27 NOTE — Patient Instructions (Signed)
Lipid Profile Test WHY AM I HAVING THIS TEST? The lipid profile test gives results that can help predict the likelihood of developing heart disease. The test is also used to monitor treatment for high cholesterol to see if you are reaching your goals. A lipid profile measures the following:  Total cholesterol. Cholesterol is a waxy fat in your blood. If your total cholesterol is elevated, this can increase your risk of coronary heart disease.  High-density lipoprotein (HDL). This is known as the good cholesterol. Having a high level of HDL is good. Your HDL level may be low if you smoke or do not get enough exercise.  Low-density lipoprotein (LDL). This is known as the bad cholesterol and is responsible for the formation of plaque in the arteries. Having a low level of LDL is best.  Cholesterol to HDL ratio. This is calculated by dividing the total cholesterol by the HDL cholesterol. The ratio is used by health care providers for determining your risk of heart disease. A low ratio is best.  Triglycerides. These are a type of fat in the blood responsible for providing energy to your cells. Low levels are best. WHAT KIND OF SAMPLE IS TAKEN? A blood sample is required for this test. It is usually collected by inserting a needle into a vein. HOW DO I PREPARE FOR THE TEST?  Do not eat or drink anything after midnight on the night before the test or as directed by your health care provider. WHAT ARE THE REFERENCE RANGES? Reference ranges are considered healthy ranges established after testing a large group of healthy people. Reference ranges may vary among different people, labs, and hospitals. It is your responsibility to obtain your test results. Ask the lab or department performing the test when and how you will get your results. Reference ranges for the lipid profile test are as follows: Total Cholesterol  Adult or elderly: less than 200 mg/dL or less than 5.20 mmol/L (SI units).  Child:  120-200 mg/dL.  Infant: 70-175 mg/dL.  Newborns: 53-135 mg/dL. HDL  Male: greater than 45 mg/dL or greater than 0.75 mmol/L (SI units).  Male: greater than 55 mg/dL or greater than 0.91 mmol/L (SI units). HDL reference values based on risk of heart disease:  For low risk of heart disease:  Male: 60 mg/dL or 1.55 mmol/L.  Male: 70 mg/dL or 1.81 mmol/L.  For moderate risk of heart disease:  Male: 45 mg/dL or 1.17 mmol/L.  Male: 55 mg/dL or 1.42 mmol/L.  For high risk of heart disease:  Male: 25 mg/dL or 0.65 mmol/L.  Male: 35 mg/dL or 0.90 mmol/L. LDL  Adult: less than 130 mg/dL.  Children: less than 110 mg/dL. Cholesterol to HDL Ratio Reference values based on risk for coronary heart disease:  Risk that is one half average:  Male: 3.4.  Male: 3.3.  Average risk:  Male: 5.0.  Male: 4.4.  Risk that is two times average (moderate risk):  Male: 10.0.  Male: 7.0.  Risk that is three times average (high risk):  Male: 24.0.  Male: 11.0. Triglycerides  Adult or elderly:  Male: 40-160 mg/dL or 0.45-1.81 mmol/L (SI units).  Male: 35-135 mg/dL or 0.40-1.52 mmol/L (SI units).  Children 54-34 years old:  Male: 30-86 mg/dL.  Male: 32-99 mg/dL.  Children 51-77 years old:  Male: 31-108 mg/dL.  Male: 35-114 mg/dL.  Children 66-53 years old:  Male: 36-138 mg/dL.  Male: 41-138 mg/dL.  Children 66-21 years old:  Male: 40-163 mg/dL.  Male: 29-128  mg/dL. Triglycerides should be less than 400 mg/dL even when you are not fasting.  WHAT DO THE RESULTS MEAN?  Talk with your health care provider to discuss your results, treatment options, and if necessary, the need for more tests. Talk with your health care provider if you have any questions about your results.   This information is not intended to replace advice given to you by your health care provider. Make sure you discuss any questions you have with your health care  provider.   Document Released: 01/07/2005 Document Revised: 01/04/2015 Document Reviewed: 04/05/2014 Elsevier Interactive Patient Education [employer].

## 2016-10-27 NOTE — Progress Notes (Signed)
BP 134/84   Pulse 72   Temp 97 F (36.1 C) (Oral)   Ht '6\' 1"'  (1.854 m)   Wt 217 lb 6 oz (98.6 kg)   BMI 28.68 kg/m    Subjective:    Patient ID: James Blackburn, male    DOB: 1976/12/11, 40 y.o.   MRN: [id number]  HPI: James Blackburn is a 40 y.o. male presenting  on 10/27/2016 for Medication refills and Labwork (patient is fasting) Patient is here for recheck on his conditions and medications. He does have a history of hypothyroidism, hypertension, hyperlipidemia, he is fasting and ready for blood work today. His medications do need to be refilled. He is not having any other issues at this time.   Past Medical History:  Diagnosis Date  . Anal fissure 2008   per Dr Day  . Chronic kidney disease   . H/O hiatal hernia   . Hyperlipidemia   . Hypertension   . Hypothyroid   . Thyroid disease    Relevant past medical, surgical, family and social history reviewed and updated as indicated. Interim medical history since our last visit reviewed. Allergies and medications reviewed and updated. DATA REVIEWED: CHART IN EPIC  Social History   Social History  . Marital status: Married    Spouse name: N/A  . Number of children: 2  . Years of education: N/A   Occupational History  . [employer]   Social History Main Topics  . Smoking status: Never Smoker  . Smokeless tobacco: Never Used  . Alcohol use No  . Drug use: No  . Sexual activity: Not on file   Other Topics Concern  . Not on file   Social History Narrative   3 caffeine drinks daily    Lives with wife and children in one story home.   High school education.   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    Past Surgical History:  Procedure Laterality Date  . VASECTOMY      Family History  Problem Relation Age of Onset  . COPD Mother     Deceased, 10  . Pneumonia Father     Deceased, late 53s  . Healthy Sister     x 2  . Healthy Son   . Healthy Daughter   . Hypertension Paternal  Grandfather     Review of Systems  Constitutional: Negative.  Negative for appetite change and fatigue.  HENT: Negative.   Eyes: Negative.  Negative for pain and visual disturbance.  Respiratory: Negative.  Negative for cough, chest tightness, shortness of breath and wheezing.   Cardiovascular: Negative.  Negative for chest pain, palpitations and leg swelling.  Gastrointestinal: Negative.  Negative for abdominal pain, diarrhea, nausea and vomiting.  Endocrine: Negative.   Genitourinary: Negative.   Musculoskeletal: Negative.   Skin: Negative.  Negative for color change and rash.  Neurological: Negative.  Negative for weakness, numbness and headaches.  Psychiatric/Behavioral: Negative.       Medication List       Accurate as of 10/27/16 11:37 AM. Always use your most recent med list.          atorvastatin 40 MG tablet Commonly known as:  LIPITOR Take 1 tablet (40 mg total) by mouth daily.   levothyroxine 75 MCG tablet Commonly known as:  SYNTHROID, LEVOTHROID Take 1 tablet (75 mcg total) by mouth daily.   lisinopril 20 MG tablet Commonly known as:  PRINIVIL,ZESTRIL Take 1 tablet (20 mg total) by mouth daily.  Objective:    BP 134/84   Pulse 72   Temp 97 F (36.1 C) (Oral)   Ht '6\' 1"'  (1.854 m)   Wt 217 lb 6 oz (98.6 kg)   BMI 28.68 kg/m   Allergies  Allergen Reactions  . Amoxicillin Other (See Comments)    Breathing difficulty  . Entex T [Pseudoephedrine-Guaifenesin] Other (See Comments)    "Makes my legs ache"  . Fenofibrate Other (See Comments)    myalgias    Wt Readings from Last 3 Encounters:  10/27/16 217 lb 6 oz (98.6 kg)  03/16/16 204 lb (92.5 kg)  11/11/15 204 lb 6.4 oz (92.7 kg)    Physical Exam  Constitutional: He appears well-developed and well-nourished. No distress.  HENT:  Head: Normocephalic and atraumatic.  Eyes: Conjunctivae and EOM are normal. Pupils are equal, round, and reactive to light.  Cardiovascular: Normal rate,  regular rhythm and normal heart sounds.   Pulmonary/Chest: Effort normal and breath sounds normal. No respiratory distress.  Skin: Skin is warm and dry.  Psychiatric: He has a normal mood and affect. His behavior is normal.  Nursing note and vitals reviewed.   Results for orders placed or performed in visit on 10/16/15  Testosterone,Free and Total  Result Value Ref Range   Testosterone 355 348 - 1,197 ng/dL   Comment, Testosterone Comment    Testosterone, Free 7.8 (L) 8.7 - 25.1 pg/mL  Thyroid Panel With TSH  Result Value Ref Range   TSH 22.840 (H) 0.450 - 4.500 uIU/mL   T4, Total 5.3 4.5 - 12.0 ug/dL   T3 Uptake Ratio 24 24 - 39 %   Free Thyroxine Index 1.3 1.2 - 4.9      Assessment & Plan:   1. Hypothyroidism, unspecified type - TSH - levothyroxine (SYNTHROID, LEVOTHROID) 75 MCG tablet; Take 1 tablet (75 mcg total) by mouth daily.  Dispense: 90 tablet; Refill: 3  2. Essential hypertension - CMP14+EGFR - Lipid panel - lisinopril (PRINIVIL,ZESTRIL) 20 MG tablet; Take 1 tablet (20 mg total) by mouth daily.  Dispense: 90 tablet; Refill: 3  3. Hyperlipidemia, unspecified hyperlipidemia type - CMP14+EGFR - Lipid panel - atorvastatin (LIPITOR) 40 MG tablet; Take 1 tablet (40 mg total) by mouth daily.  Dispense: 90 tablet; Refill: 3   Continue all other maintenance medications as listed above.  Follow up plan: Return in about 6 months (around 04/26/2017) for recheck.  Orders Placed This Encounter  Procedures  . CMP14+EGFR  . Lipid panel  . TSH    Educational handout given for lipid testing  Terald Sleeper PA-C [facility] [address]  [place], [place] [address] [phone]   10/27/2016, 11:37 AM

## 2016-10-28 ENCOUNTER — Other Ambulatory Visit: Payer: Self-pay | Admitting: Physician Assistant

## 2016-10-28 DIAGNOSIS — R748 Abnormal levels of other serum enzymes: Secondary | ICD-10-CM

## 2016-10-28 LAB — CMP14+EGFR
A/G RATIO: 1.7 (ref 1.2–2.2)
ALT: 62 IU/L — AB (ref 0–44)
AST: 44 IU/L — AB (ref 0–40)
Albumin: 4.6 g/dL (ref 3.5–5.5)
Alkaline Phosphatase: 93 IU/L (ref 39–117)
BUN/Creatinine Ratio: 9 (ref 9–20)
BUN: 7 mg/dL (ref 6–20)
Bilirubin Total: 0.7 mg/dL (ref 0.0–1.2)
CALCIUM: 9.6 mg/dL (ref 8.7–10.2)
CO2: 24 mmol/L (ref 18–29)
Chloride: 101 mmol/L (ref 96–106)
Creatinine, Ser: 0.77 mg/dL (ref 0.76–1.27)
GFR calc Af Amer: 132 mL/min/{1.73_m2} (ref >59)
GFR, EST NON AFRICAN AMERICAN: 114 mL/min/{1.73_m2} (ref >59)
GLUCOSE: 88 mg/dL (ref 65–99)
Globulin, Total: 2.7 g/dL (ref 1.5–4.5)
POTASSIUM: 4.3 mmol/L (ref 3.5–5.2)
Sodium: 141 mmol/L (ref 134–144)
Total Protein: 7.3 g/dL (ref 6.0–8.5)

## 2016-10-28 LAB — TSH: TSH: 2.78 u[IU]/mL (ref 0.450–4.500)

## 2016-10-28 LAB — LIPID PANEL
CHOL/HDL RATIO: 4.3 ratio (ref 0.0–5.0)
Cholesterol, Total: 116 mg/dL (ref 100–199)
HDL: 27 mg/dL — AB (ref >39)
LDL Calculated: 59 mg/dL (ref 0–99)
TRIGLYCERIDES: 149 mg/dL (ref 0–149)
VLDL Cholesterol Cal: 30 mg/dL (ref 5–40)

## 2016-11-30 ENCOUNTER — Telehealth: Payer: Self-pay

## 2016-11-30 NOTE — Telephone Encounter (Signed)
Contacted patient to make follow up on lab work. Patient verbalizes understanding and states that he will call and make appointment when he knows what day he can come in.

## 2016-12-02 ENCOUNTER — Encounter: Payer: Self-pay | Admitting: Family Medicine

## 2016-12-02 ENCOUNTER — Ambulatory Visit (INDEPENDENT_AMBULATORY_CARE_PROVIDER_SITE_OTHER): Payer: Managed Care, Other (non HMO) | Admitting: Family Medicine

## 2016-12-02 VITALS — BP 119/81 | HR 74 | Temp 97.6°F | Ht 73.0 in | Wt 221.8 lb

## 2016-12-02 DIAGNOSIS — R740 Nonspecific elevation of levels of transaminase and lactic acid dehydrogenase [LDH]: Secondary | ICD-10-CM

## 2016-12-02 DIAGNOSIS — IMO0002 Reserved for concepts with insufficient information to code with codable children: Secondary | ICD-10-CM

## 2016-12-02 NOTE — Progress Notes (Signed)
   HPI  Patient presents today for follow-up of elevated transaminases.  Patient feels well and has no complaints. He's been on lipitor for a few years.   He had a recent mild elevation in AST and ALT. He denies any alcohol or Tylenol use.  He does not really watch his diet, however he is planning to after the first year as well as started regular exercise routine. He is currently very active with hunting.    PMH: Smoking status noted ROS: Per HPI  Objective: BP 119/81   Pulse 74   Temp 97.6 F (36.4 C) (Oral)   Ht _0  (1.854 m)   Wt 221 lb 12.8 oz (100.6 kg)   BMI 29.26 kg/m  Gen: NAD, alert, cooperative with exam HEENT: NCATL CV: RRR, good S1/S2, no murmur Resp: CTABL, no wheezes, non-labored Abd: SNTND, BS present, no guarding or organomegaly Ext: No edema, warm Neuro: Alert and oriented, No gross deficits  Assessment and plan:  # Transaminase elevation Repeating CMP today Discussed with patient to avoid ethanol and regular Tylenol use Possibly due to Lipitor, also consider fatty liver infiltrate, this would actually be helped by Lipitor. If abs are stable we'll likely recommend follow-up labs in 3 months, if elevated consider stopping Lipitor and checking ultrasound to establish diagnosis of fatty liver.    Orders Placed This Encounter  Procedures  . [place], MD [facility] 12/02/2016, 11:14 AM

## 2016-12-02 NOTE — Patient Instructions (Signed)
Great to see you!   We will call with labs within 1 week 

## 2016-12-03 ENCOUNTER — Telehealth: Payer: Self-pay | Admitting: Family Medicine

## 2016-12-03 LAB — CMP14+EGFR
ALK PHOS: 88 IU/L (ref 39–117)
ALT: 61 IU/L — AB (ref 0–44)
AST: 37 IU/L (ref 0–40)
Albumin/Globulin Ratio: 1.8 (ref 1.2–2.2)
Albumin: 4.5 g/dL (ref 3.5–5.5)
BILIRUBIN TOTAL: 0.7 mg/dL (ref 0.0–1.2)
BUN/Creatinine Ratio: 12 (ref 9–20)
BUN: 9 mg/dL (ref 6–20)
CHLORIDE: 102 mmol/L (ref 96–106)
CO2: 25 mmol/L (ref 18–29)
CREATININE: 0.75 mg/dL — AB (ref 0.76–1.27)
Calcium: 9.7 mg/dL (ref 8.7–10.2)
GFR calc non Af Amer: 116 mL/min/{1.73_m2} (ref >59)
GFR, EST AFRICAN AMERICAN: 134 mL/min/{1.73_m2} (ref >59)
GLUCOSE: 99 mg/dL (ref 65–99)
Globulin, Total: 2.5 g/dL (ref 1.5–4.5)
Potassium: 4.2 mmol/L (ref 3.5–5.2)
Sodium: 141 mmol/L (ref 134–144)
TOTAL PROTEIN: 7 g/dL (ref 6.0–8.5)

## 2016-12-04 NOTE — Telephone Encounter (Signed)
Pt aware of labs  

## 2017-05-21 ENCOUNTER — Encounter: Payer: Self-pay | Admitting: Family Medicine

## 2017-05-21 ENCOUNTER — Ambulatory Visit (INDEPENDENT_AMBULATORY_CARE_PROVIDER_SITE_OTHER): Payer: Managed Care, Other (non HMO) | Admitting: Family Medicine

## 2017-05-21 VITALS — BP 129/81 | HR 66 | Temp 98.3°F | Ht 73.0 in | Wt 225.0 lb

## 2017-05-21 DIAGNOSIS — Z23 Encounter for immunization: Secondary | ICD-10-CM | POA: Diagnosis not present

## 2017-05-21 DIAGNOSIS — S80922A Unspecified superficial injury of left lower leg, initial encounter: Secondary | ICD-10-CM

## 2017-05-21 DIAGNOSIS — T148XXA Other injury of unspecified body region, initial encounter: Secondary | ICD-10-CM

## 2017-05-21 NOTE — Addendum Note (Signed)
Addended by: Michaela Corner on: 05/21/2017 04:44 PM   Modules accepted: Orders

## 2017-05-21 NOTE — Progress Notes (Signed)
BP 129/81   Pulse 66   Temp 98.3 F (36.8 C) (Oral)   Ht 6\' 1"  (1.854 m)   Wt 225 lb (102.1 kg)   BMI 29.69 kg/m    Subjective:    Patient ID: James Blackburn, male    DOB: Apr 02, 1976, 41 y.o.   MRN: [id number]  HPI: James Blackburn is a 41 y.o. male presenting on 05/21/2017 for Cut on left calf (cut on a piece of metal on Sunday, woke up yesterday and was uncomfortable)   HPI Abrasion on left calf Patient sustained an abrasion on his left calf after a piece of metal cut him on the back of his leg on Sunday about 5 days ago. He said he didn't notice it until 2 days ago and then he started getting bruising and soreness around it. He thinks he may have also had some cramping in his calf but does not know for sure if he's had that today. Mainly it is just sore around where the cut is around with the bruising is. He is able to ambulate and walk just fine. He denies any shortness of breath or wheezing or tightness in the other muscles.  Relevant past medical, surgical, family and social history reviewed and updated as indicated. Interim medical history since our last visit reviewed. Allergies and medications reviewed and updated.  Review of Systems  Constitutional: Negative for chills and fever.  Respiratory: Negative for shortness of breath and wheezing.   Cardiovascular: Negative for chest pain and leg swelling.  Musculoskeletal: Negative for back pain, gait problem and myalgias.  Skin: Positive for color change and wound. Negative for rash.  All other systems reviewed and are negative.   Per HPI unless specifically indicated above        Objective:    BP 129/81   Pulse 66   Temp 98.3 F (36.8 C) (Oral)   Ht 6\' 1"  (1.854 m)   Wt 225 lb (102.1 kg)   BMI 29.69 kg/m   Wt Readings from Last 3 Encounters:  05/21/17 225 lb (102.1 kg)  12/02/16 221 lb 12.8 oz (100.6 kg)  10/27/16 217 lb 6 oz (98.6 kg)    Physical Exam  Constitutional: He is oriented to person,  place, and time. He appears well-developed and well-nourished. No distress.  Eyes: Conjunctivae are normal. No scleral icterus.  Cardiovascular: Normal rate, regular rhythm, normal heart sounds and intact distal pulses.   No murmur heard. Pulmonary/Chest: Effort normal and breath sounds normal. No respiratory distress. He has no wheezes. He has no rales.  Musculoskeletal: Normal range of motion. He exhibits no edema.       Left lower leg: He exhibits no tenderness (No tenderness or cramping, full range of motion) and no swelling.  Neurological: He is alert and oriented to person, place, and time. Coordination normal.  Skin: Skin is warm and dry. Abrasion (Abrasion across the whole back of his left calf with slight bruising around it. No signs of infection or purulence or erythema.) noted. No rash noted. He is not diaphoretic.  Psychiatric: He has a normal mood and affect. His behavior is normal.  Nursing note and vitals reviewed.       Assessment & Plan:   Problem List Items Addressed This Visit    None    Visit Diagnoses    Abrasion    -  Primary   Left calf abrasion with bruising, got it from moving old water heater, will do tetanus shot,  no signs of tetany today       Follow up plan: Return if symptoms worsen or fail to improve.  Counseling provided for all of the vaccine components No orders of the defined types were placed in this encounter.   Caryl Pina, MD [facility] 05/21/2017, 3:42 PM

## 2017-11-24 ENCOUNTER — Other Ambulatory Visit: Payer: Self-pay | Admitting: Physician Assistant

## 2017-11-24 DIAGNOSIS — I10 Essential (primary) hypertension: Secondary | ICD-10-CM

## 2017-11-24 DIAGNOSIS — E785 Hyperlipidemia, unspecified: Secondary | ICD-10-CM

## 2017-11-25 ENCOUNTER — Other Ambulatory Visit: Payer: Self-pay | Admitting: *Deleted

## 2017-11-25 DIAGNOSIS — E039 Hypothyroidism, unspecified: Secondary | ICD-10-CM

## 2017-11-25 MED ORDER — LEVOTHYROXINE SODIUM 75 MCG PO TABS: 75.0000 ug | ORAL_TABLET | Freq: Every day | ORAL | 0 refills | Status: DC

## 2017-12-23 ENCOUNTER — Other Ambulatory Visit: Payer: Self-pay

## 2017-12-23 DIAGNOSIS — I10 Essential (primary) hypertension: Secondary | ICD-10-CM

## 2017-12-23 DIAGNOSIS — E039 Hypothyroidism, unspecified: Secondary | ICD-10-CM

## 2017-12-23 DIAGNOSIS — E785 Hyperlipidemia, unspecified: Secondary | ICD-10-CM

## 2017-12-23 MED ORDER — ATORVASTATIN CALCIUM 40 MG PO TABS: 40.0000 mg | ORAL_TABLET | Freq: Every day | ORAL | 0 refills | Status: DC

## 2017-12-23 MED ORDER — LEVOTHYROXINE SODIUM 75 MCG PO TABS: 75.0000 ug | ORAL_TABLET | Freq: Every day | ORAL | 0 refills | Status: DC

## 2017-12-23 MED ORDER — LISINOPRIL 20 MG PO TABS: 20.0000 mg | ORAL_TABLET | Freq: Every day | ORAL | 0 refills | Status: DC

## 2017-12-23 NOTE — Progress Notes (Signed)
Patient has apt in [place] with Wendi Snipes and will run out of meds- given one month supply.

## 2017-12-30 ENCOUNTER — Ambulatory Visit (INDEPENDENT_AMBULATORY_CARE_PROVIDER_SITE_OTHER): Payer: Managed Care, Other (non HMO) | Admitting: Family Medicine

## 2017-12-30 ENCOUNTER — Ambulatory Visit: Payer: Managed Care, Other (non HMO) | Admitting: Family Medicine

## 2017-12-30 ENCOUNTER — Encounter: Payer: Self-pay | Admitting: Family Medicine

## 2017-12-30 VITALS — BP 120/80 | HR 83 | Temp 97.2°F | Ht 73.0 in | Wt 232.4 lb

## 2017-12-30 DIAGNOSIS — Z0000 Encounter for general adult medical examination without abnormal findings: Secondary | ICD-10-CM | POA: Diagnosis not present

## 2017-12-30 DIAGNOSIS — E039 Hypothyroidism, unspecified: Secondary | ICD-10-CM

## 2017-12-30 DIAGNOSIS — E785 Hyperlipidemia, unspecified: Secondary | ICD-10-CM

## 2017-12-30 DIAGNOSIS — I10 Essential (primary) hypertension: Secondary | ICD-10-CM

## 2017-12-30 MED ORDER — LEVOTHYROXINE SODIUM 75 MCG PO TABS: 75.0000 ug | ORAL_TABLET | Freq: Every day | ORAL | 3 refills | Status: DC

## 2017-12-30 MED ORDER — ATORVASTATIN CALCIUM 40 MG PO TABS
40.0000 mg | ORAL_TABLET | Freq: Every day | ORAL | 3 refills | Status: DC
Start: 2017-12-30 — End: 2018-01-03

## 2017-12-30 MED ORDER — LISINOPRIL 20 MG PO TABS
20.0000 mg | ORAL_TABLET | Freq: Every day | ORAL | 3 refills | Status: DC
Start: 2017-12-30 — End: 2019-01-24

## 2017-12-30 NOTE — Progress Notes (Signed)
   HPI  Patient presents today.  Patient feels well, he is upset about his weight.  He states that he wants to lose about 60 pounds and is about to get busy in the gym. We discussed healthy weight loss goals. He is a non-smoker, his primary dietary indiscretion is sweet tea.  He does have occasional bloody stools with pain, he has been diagnosed with anal fissure by GI.  Previously had a mildly elevated liver enzyme, he does not drink alcohol.   PMH: Smoking status noted ROS: Per HPI  Objective: BP 120/80   Pulse 83   Temp (!) 97.2 F (36.2 C) (Oral)   Ht '6\' 1"'$  (1.854 m)   Wt 232 lb 6.4 oz (105.4 kg)   BMI 30.66 kg/m  Gen: NAD, alert, cooperative with exam HEENT: NCAT, EOMI, PERRL CV: RRR, good S1/S2, no murmur Resp: CTABL, no wheezes, non-labored Abd: SNTND, BS present, no guarding or organomegaly Ext: No edema, warm Neuro: Alert and oriented, No gross deficits  Assessment and plan:  #Annual physical exam Normal exam except for weight, discussed therapeutic lifestyle changes and recommended 5-10% weight loss goal.   #Hypertension Well-controlled on current medications, no changes Labs, fasting  #Hyperlipidemia Tolerating statin, refilled Labs  #Hypothyroidism Patient thinks this could be related to his weight gain, repeat labs, continue Synthroid    Orders Placed This Encounter  Procedures  . Lipid panel  . CMP14+EGFR  . CBC with Differential/Platelet  . TSH    Meds ordered this encounter  Medications  . lisinopril (PRINIVIL,ZESTRIL) 20 MG tablet    Sig: Take 1 tablet (20 mg total) by mouth daily.    Dispense:  90 tablet    Refill:  3  . atorvastatin (LIPITOR) 40 MG tablet    Sig: Take 1 tablet (40 mg total) by mouth daily.    Dispense:  90 tablet    Refill:  3  . levothyroxine (SYNTHROID, LEVOTHROID) 75 MCG tablet    Sig: Take 1 tablet (75 mcg total) by mouth daily.    Dispense:  90 tablet    Refill:  [place]ed[place], MD [facility] 12/30/2017, 12:21 PM

## 2017-12-31 ENCOUNTER — Telehealth: Payer: Self-pay | Admitting: Family Medicine

## 2017-12-31 LAB — CMP14+EGFR
A/G RATIO: 1.6 (ref 1.2–2.2)
ALK PHOS: 92 IU/L (ref 39–117)
ALT: 71 IU/L — AB (ref 0–44)
AST: 49 IU/L — AB (ref 0–40)
Albumin: 4.6 g/dL (ref 3.5–5.5)
BILIRUBIN TOTAL: 0.9 mg/dL (ref 0.0–1.2)
BUN/Creatinine Ratio: 13 (ref 9–20)
BUN: 10 mg/dL (ref 6–24)
CALCIUM: 9.4 mg/dL (ref 8.7–10.2)
CHLORIDE: 104 mmol/L (ref 96–106)
CO2: 21 mmol/L (ref 20–29)
Creatinine, Ser: 0.76 mg/dL (ref 0.76–1.27)
GFR calc Af Amer: 131 mL/min/{1.73_m2} (ref >59)
GFR calc non Af Amer: 113 mL/min/{1.73_m2} (ref >59)
GLOBULIN, TOTAL: 2.8 g/dL (ref 1.5–4.5)
Glucose: 89 mg/dL (ref 65–99)
POTASSIUM: 4.2 mmol/L (ref 3.5–5.2)
Sodium: 141 mmol/L (ref 134–144)
Total Protein: 7.4 g/dL (ref 6.0–8.5)

## 2017-12-31 LAB — CBC WITH DIFFERENTIAL/PLATELET
BASOS ABS: 0 10*3/uL (ref 0.0–0.2)
Basos: 1 %
EOS (ABSOLUTE): 0.2 10*3/uL (ref 0.0–0.4)
Eos: 3 %
Hematocrit: 49.3 % (ref 37.5–51.0)
Hemoglobin: 17.3 g/dL (ref 13.0–17.7)
IMMATURE GRANS (ABS): 0 10*3/uL (ref 0.0–0.1)
Immature Granulocytes: 0 %
LYMPHS: 43 %
Lymphocytes Absolute: 2.8 10*3/uL (ref 0.7–3.1)
MCH: 30.7 pg (ref 26.6–33.0)
MCHC: 35.1 g/dL (ref 31.5–35.7)
MCV: 88 fL (ref 79–97)
Monocytes Absolute: 0.7 10*3/uL (ref 0.1–0.9)
Monocytes: 10 %
NEUTROS PCT: 43 %
Neutrophils Absolute: 2.8 10*3/uL (ref 1.4–7.0)
PLATELETS: 275 10*3/uL (ref 150–379)
RBC: 5.63 x10E6/uL (ref 4.14–5.80)
RDW: 14 % (ref 12.3–15.4)
WBC: 6.6 10*3/uL (ref 3.4–10.8)

## 2017-12-31 LAB — LIPID PANEL
CHOL/HDL RATIO: 4.6 ratio (ref 0.0–5.0)
Cholesterol, Total: 129 mg/dL (ref 100–199)
HDL: 28 mg/dL — AB (ref >39)
LDL CALC: 67 mg/dL (ref 0–99)
TRIGLYCERIDES: 168 mg/dL — AB (ref 0–149)
VLDL CHOLESTEROL CAL: 34 mg/dL (ref 5–40)

## 2017-12-31 LAB — TSH: TSH: 1.77 u[IU]/mL (ref 0.450–4.500)

## 2017-12-31 NOTE — Telephone Encounter (Signed)
Called and left voicemail, no serious findings, however did have an idea and will attempt to call again Monday.  Considering elevated LFTs I will have a trial off of Lipitor, we do not have records far enough back to see how severe his hyperlipidemia was, however its aggressive treatment for a 42 year old male.  Still equally elevated would recommend ultrasound to check structure of the liver and to consider return to Lipitor based on his fasting lipids at that time  Laroy Apple, MD [facility] 12/31/2017, 5:18 PM

## 2018-01-03 ENCOUNTER — Telehealth: Payer: Self-pay | Admitting: Family Medicine

## 2018-01-03 DIAGNOSIS — R945 Abnormal results of liver function studies: Principal | ICD-10-CM

## 2018-01-03 DIAGNOSIS — R7989 Other specified abnormal findings of blood chemistry: Secondary | ICD-10-CM

## 2018-01-03 NOTE — Telephone Encounter (Signed)
Patient with mildly elevated LFTs, he is unsure of how severe his hyperlipidemia was when he was started years ago.  For now we will discontinue times 2-3 months and repeat labs. We will recheck cholesterol as well as LFTs to see if these are improved and to see if statins are necessary for him considering his age and new concerns for side effects.  Laroy Apple, MD [facility] 01/03/2018, 7:00 PM

## 2018-01-03 NOTE — Telephone Encounter (Signed)
Looks like you tried to call him on 12/31/17, he returned call today

## 2018-02-11 ENCOUNTER — Encounter: Payer: Self-pay | Admitting: Family Medicine

## 2018-02-11 ENCOUNTER — Ambulatory Visit: Payer: Managed Care, Other (non HMO) | Admitting: Family Medicine

## 2018-02-11 VITALS — BP 125/79 | HR 79 | Temp 97.9°F | Ht 73.0 in | Wt 227.0 lb

## 2018-02-11 DIAGNOSIS — R945 Abnormal results of liver function studies: Secondary | ICD-10-CM

## 2018-02-11 DIAGNOSIS — E785 Hyperlipidemia, unspecified: Secondary | ICD-10-CM

## 2018-02-11 DIAGNOSIS — R0789 Other chest pain: Secondary | ICD-10-CM

## 2018-02-11 DIAGNOSIS — R7989 Other specified abnormal findings of blood chemistry: Secondary | ICD-10-CM

## 2018-02-11 MED ORDER — METHYLPREDNISOLONE ACETATE 80 MG/ML IJ SUSP
80.0000 mg | Freq: Once | INTRAMUSCULAR | Status: AC
  Administered 2018-02-11: 80 mg via INTRAMUSCULAR

## 2018-02-11 MED ORDER — CYCLOBENZAPRINE HCL 10 MG PO TABS: 10.0000 mg | ORAL_TABLET | Freq: Three times a day (TID) | ORAL | 0 refills | Status: DC | PRN

## 2018-02-11 NOTE — Patient Instructions (Signed)
Great to see you!   Nonspecific Chest Pain Chest pain can be caused by many different conditions. There is a chance that your pain could be related to something serious, such as a heart attack or a blood clot in your lungs. Chest pain can also be caused by conditions that are not life-threatening. If you have chest pain, it is very important to follow up with your doctor. Follow these instructions at home: Medicines  If you were prescribed an antibiotic medicine, take it as told by your doctor. Do not stop taking the antibiotic even if you start to feel better.  Take over-the-counter and prescription medicines only as told by your doctor. Lifestyle  Do not use any products that contain nicotine or tobacco, such as cigarettes and e-cigarettes. If you need help quitting, ask your doctor.  Do not drink alcohol.  Make lifestyle changes as told by your doctor. These may include: ? Getting regular exercise. Ask your doctor for some activities that are safe for you. ? Eating a heart-healthy diet. A diet specialist (dietitian) can help you to learn healthy eating options. ? Staying at a healthy weight. ? Managing diabetes, if needed. ? Lowering your stress, as with deep breathing or spending time in nature. General instructions  Avoid any activities that make you feel chest pain.  If your chest pain is because of heartburn: ? Raise (elevate) the head of your bed about 6 inches (15 cm). You can do this by putting blocks under the bed legs at the head of the bed. ? Do not sleep with extra pillows under your head. That does not help heartburn.  Keep all follow-up visits as told by your doctor. This is important. This includes any further testing if your chest pain does not go away. Contact a doctor if:  Your chest pain does not go away.  You have a rash with blisters on your chest.  You have a fever.  You have chills. Get help right away if:  Your chest pain is worse.  You have a  cough that gets worse, or you cough up blood.  You have very bad (severe) pain in your belly (abdomen).  You are very weak.  You pass out (faint).  You have either of these for no clear reason: ? Sudden chest discomfort. ? Sudden discomfort in your arms, back, neck, or jaw.  You have shortness of breath at any time.  You suddenly start to sweat, or your skin gets clammy.  You feel sick to your stomach (nauseous).  You throw up (vomit).  You suddenly feel light-headed or dizzy.  Your heart starts to beat fast, or it feels like it is skipping beats. These symptoms may be an emergency. Do not wait to see if the symptoms will go away. Get medical help right away. Call your local emergency services (911 in the [place] Do not drive yourself to the hospital. This information is not intended to replace advice given to you by your health care provider. Make sure you discuss any questions you have with your health care provider. Document Released: 06/01/2008 Document Revised: 09/07/2016 Document Reviewed: 09/07/2016 Elsevier Interactive Patient Education  2017 [employer].

## 2018-02-11 NOTE — Progress Notes (Signed)
   HPI  Patient presents today here to follow-up from urgent care.  Patient was seen in urgent care 5 days ago for chest pain and neck pain. An EKG was performed that was reported to be negative. Patient was told to go to the emergency room which she refused to.  He continues to refuse to go today.  He does not believe that his heart.  Patient states that he has left-sided chest pain rating to his left neck at times at the left neck pain rating to his left chest. It happens at rest, it lasts 10 minutes to 60 minutes.  It is not exertional. He has a history of elevated cholesterol which she was previously started on Lipitor for.  His LFTs were slightly elevated and I instructed him to stop Lipitor about 1 month ago.  He has well-controlled hypertension. He has not a smoker, he denies family history of heart disease  PMH: Smoking status noted ROS: Per HPI  Objective: BP 125/79   Pulse 79   Temp 97.9 F (36.6 C) (Oral)   Ht 6\' 1"  (1.854 m)   Wt 227 lb (103 kg)   BMI 29.95 kg/m  Gen: NAD, alert, cooperative with exam HEENT: NCAT CV: RRR, good S1/S2, no murmur Chest wall: Tenderness to palpation of the left upper pectoral muscle, patient states that this reproduces his pain, also mild tenderness to palpation of the left neck Resp: CTABL, no wheezes, non-labored Ext: No edema, warm Neuro: Alert and oriented, No gross deficits  Assessment and plan:  #Atypical chest pain I believe his pain is most likely musculoskeletal, there is non-exertional component, he has some risk factors with previous hyperlipidemia and hypertension, however these have been very well controlled recently.  I have had him stop his statin in the last month due to his age and slightly elevated LFTs. Given IM Depo-Medrol today, avoiding NSAIDs with black box warning for cardiac reasons. Also given Flexeril Patient was advised to seek emergency medical help from urgent care and he does have some elevated risk  with hypertension and hyperlipidemia so I recommended cardiology evaluation and referral has been placed.  #Elevated LFTs Fatty liver versus statin induced, repeat labs today Statin stopped about 4-6 weeks ago  #Hyperlipidemia Patient had been on statins for greater than 2 years, I do not have pre-statin labs to compare to, he is now been off of meds for 6 weeks Fasting labs today   Orders Placed This Encounter  Procedures  . Hepatic function panel  . Lipid panel  . Ambulatory referral to Cardiology    Referral Priority:   Routine    Referral Type:   Consultation    Referral Reason:   Specialty Services Required    Requested Specialty:   Cardiology    Number of Visits Requested:   1    Meds ordered this encounter  Medications  . methylPREDNISolone acetate (DEPO-MEDROL) injection 80 mg  . cyclobenzaprine (FLEXERIL) 10 MG tablet    Sig: Take 1 tablet (10 mg total) by mouth 3 (three) times daily as needed for muscle spasms.    Dispense:  30 tablet    Refill:  0    Laroy Apple, MD [facility] 02/11/2018, 10:01 AM

## 2018-02-12 LAB — HEPATIC FUNCTION PANEL
ALBUMIN: 4.6 g/dL (ref 3.5–5.5)
ALK PHOS: 86 IU/L (ref 39–117)
ALT: 67 IU/L — ABNORMAL HIGH (ref 0–44)
AST: 41 IU/L — ABNORMAL HIGH (ref 0–40)
BILIRUBIN TOTAL: 0.5 mg/dL (ref 0.0–1.2)
BILIRUBIN, DIRECT: 0.13 mg/dL (ref 0.00–0.40)
TOTAL PROTEIN: 7.5 g/dL (ref 6.0–8.5)

## 2018-02-12 LAB — LIPID PANEL
CHOL/HDL RATIO: 5.6 ratio — AB (ref 0.0–5.0)
Cholesterol, Total: 175 mg/dL (ref 100–199)
HDL: 31 mg/dL — ABNORMAL LOW (ref >39)
LDL CALC: 109 mg/dL — AB (ref 0–99)
TRIGLYCERIDES: 177 mg/dL — AB (ref 0–149)
VLDL Cholesterol Cal: 35 mg/dL (ref 5–40)

## 2018-02-27 NOTE — Progress Notes (Signed)
Referring-Samuel Wendi Snipes MD Reason for referral-Chest pain  HPI: 42 yo male for evaluation of chest pain at request of Kenn File MD. Labs 02/11/18 showed SGOT 41 and SGPT 67.  Over the preceding 3 weeks patient complains of intermittent left neck and chest pain.  His symptoms increased with turning his head.  No associated symptoms.  The pain is not exertional or pleuritic.  He otherwise denies dyspnea on exertion, orthopnea, PND, pedal edema or exertional chest pain.  Because of the above we were asked to evaluate.  Current Outpatient Medications  Medication Sig Dispense Refill  . levothyroxine (SYNTHROID, LEVOTHROID) 75 MCG tablet Take 1 tablet (75 mcg total) by mouth daily. 90 tablet 3  . lisinopril (PRINIVIL,ZESTRIL) 20 MG tablet Take 1 tablet (20 mg total) by mouth daily. 90 tablet 3   No current facility-administered medications for this visit.     Allergies  Allergen Reactions  . Amoxicillin Other (See Comments)    Breathing difficulty  . Entex T [Pseudoephedrine-Guaifenesin] Other (See Comments)    "Makes my legs ache"  . Fenofibrate Other (See Comments)    myalgias     Past Medical History:  Diagnosis Date  . Anal fissure 2008   per Dr Day  . H/O hiatal hernia   . Hyperlipidemia   . Hypertension   . Hypothyroid   . Nephrolithiasis     Past Surgical History:  Procedure Laterality Date  . TONSILLECTOMY    . VASECTOMY      Social History   Socioeconomic History  . Marital status: Married    Spouse name: Not on file  . Number of children: 2  . Years of education: Not on file  . Highest education level: Not on file  Social Needs  . Financial resource strain: Not on file  . Food insecurity - worry: Not on file  . Food insecurity - inability: Not on file  . Transportation needs - medical: Not on file  . Transportation needs - non-medical: Not on file  Occupational History  . Occupation: Glass blower/designer: [employer]  Tobacco Use  . Smoking  status: Never Smoker  . Smokeless tobacco: Never Used  Substance and Sexual Activity  . Alcohol use: No  . Drug use: No  . Sexual activity: Not on file  Other Topics Concern  . Not on file  Social History Narrative   3 caffeine drinks daily    Lives with wife and children in one story home.   High school education.   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    Family History  Problem Relation Age of Onset  . COPD Mother        Deceased, 38  . Pneumonia Father        Deceased, late 107s  . Healthy Sister        x 2  . Healthy Son   . Healthy Daughter   . Hypertension Paternal Grandfather     ROS: no fevers or chills, productive cough, hemoptysis, dysphasia, odynophagia, melena, hematochezia, dysuria, hematuria, rash, seizure activity, orthopnea, PND, pedal edema, claudication. Remaining systems are negative.  Physical Exam:   Blood pressure (!) 140/102, pulse 74, height 6\' 1"  (1.854 m), weight 226 lb (102.5 kg).  General:  Well developed/well nourished in NAD Skin warm/dry Patient not depressed No peripheral clubbing Back-normal HEENT-normal/normal eyelids Neck supple/normal carotid upstroke bilaterally; no bruits; no JVD; no thyromegaly chest - CTA/ normal expansion CV - RRR/normal S1 and S2; no  murmurs, rubs or gallops;  PMI nondisplaced Abdomen -NT/ND, no HSM, no mass, + bowel sounds, no bruit 2+ femoral pulses, no bruits Ext-no edema, chords, 2+ DP Neuro-grossly nonfocal  ECG - NSR rate 74, normal axis, no ST changes.  Personally reviewed  A/P  1 chest pain-symptoms seem most consistent with musculoskeletal pain.  I have asked him to take ibuprofen to see if this helps his symptoms.  His electrocardiogram is normal.  He does not have exertional chest pain.  We will not pursue further ischemia evaluation unless symptoms worsen.  2 hypertension-blood pressure is elevated today but he checks this at home and typically controlled.  Would follow and advance lisinopril  as needed.  3 hyperlipidemia-managed by primary care.  Kirk Ruths, MD

## 2018-02-28 ENCOUNTER — Ambulatory Visit: Payer: Managed Care, Other (non HMO) | Admitting: Family Medicine

## 2018-03-04 ENCOUNTER — Encounter: Payer: Self-pay | Admitting: Cardiology

## 2018-03-04 ENCOUNTER — Ambulatory Visit: Payer: Managed Care, Other (non HMO) | Admitting: Cardiology

## 2018-03-04 VITALS — BP 140/102 | HR 74 | Ht 73.0 in | Wt 226.0 lb

## 2018-03-04 DIAGNOSIS — I10 Essential (primary) hypertension: Secondary | ICD-10-CM | POA: Diagnosis not present

## 2018-03-04 DIAGNOSIS — E7800 Pure hypercholesterolemia, unspecified: Secondary | ICD-10-CM

## 2018-03-04 DIAGNOSIS — R079 Chest pain, unspecified: Secondary | ICD-10-CM

## 2018-03-04 NOTE — Patient Instructions (Signed)
Medication Instructions:  Your physician recommends that you continue on your current medications as directed. Please refer to the Current Medication list given to you today.   Labwork: none  Testing/Procedures: none  Follow-Up: Follow up with Dr. Stanford Breed as needed.   Any Other Special Instructions Will Be Listed Below (If Applicable).     If you need a refill on your cardiac medications before your next appointment, please call your pharmacy.

## 2018-03-30 ENCOUNTER — Ambulatory Visit: Payer: Managed Care, Other (non HMO) | Admitting: Cardiology

## 2018-07-20 ENCOUNTER — Encounter: Payer: Self-pay | Admitting: Family Medicine

## 2018-07-20 ENCOUNTER — Ambulatory Visit (INDEPENDENT_AMBULATORY_CARE_PROVIDER_SITE_OTHER): Payer: Managed Care, Other (non HMO) | Admitting: Family Medicine

## 2018-07-20 ENCOUNTER — Ambulatory Visit (INDEPENDENT_AMBULATORY_CARE_PROVIDER_SITE_OTHER): Payer: Managed Care, Other (non HMO)

## 2018-07-20 VITALS — BP 140/93 | HR 84 | Temp 97.2°F | Ht 73.0 in | Wt 225.0 lb

## 2018-07-20 DIAGNOSIS — K625 Hemorrhage of anus and rectum: Secondary | ICD-10-CM | POA: Diagnosis not present

## 2018-07-20 DIAGNOSIS — M25532 Pain in left wrist: Secondary | ICD-10-CM | POA: Diagnosis not present

## 2018-07-20 NOTE — Progress Notes (Signed)
   HPI  Patient presents today here after a left wrist injury also to discuss rectal bleeding.  Left wrist pain 2 days ago he was hit with a baseball in the left lateral wrist.  He states that he has had odd tingling in his left pinky/fifth digit since that time.  He denies any weakness. He is taken ibuprofen and applied ice.   He reports several year history of intermittent bleeding with stooling. He is been seen by GI before who recommended colonoscopy, at the time his insurance would not cover this, diagnostic colonoscopy, now he would like to be referred back as needs told they will cover it.  PMH: Smoking status noted ROS: Per HPI  Objective: BP (!) 140/93   Pulse 84   Temp (!) 97.2 F (36.2 C) (Oral)   Ht 6\' 1"  (1.854 m)   Wt 225 lb (102.1 kg)   BMI 29.69 kg/m  Gen: NAD, alert, cooperative with exam HEENT: NCAT CV: RRR, good S1/S2, no murmur Resp: CTABL, no wheezes, non-labored Ext: No edema, warm Neuro: Alert and oriented, No gross deficits, 5/5 grip strength bilaterally MSK:  No tenderness with pressure applied to radius and ulna, full range of motion of the left wrist, he has a area consistent with the threads on a baseball around the left ulnar styloid   Assessment and plan:  #Left wrist pain After a baseball hit to the ulnar Patient has impressive markings on the skin consistent with the threads of the baseball No fracture seen on plain films, radiology read pending. Patient does have some symptoms of ulnar nerve involvement with tingling of the left fifth digit, he has no loss of strength. Continue ice and NSAIDs Follow-up if numbness or tingling worsens or does not improve, discussed importance of coming writing if he develops any weakness in the grip  #Rectal bleeding 5+ year history of intermittent rectal bleeding, patient states that he had something like an anal tear described by GI who recommended colonoscopy. Refer back to GI, he is interested in a  colonoscopy at this time, he states that his insurance was the only limiting factor previously and he should have good coverage now      Orders Placed This Encounter  Procedures  . DG Wrist Complete Left    Standing Status:   Future    Number of Occurrences:   1    Standing Expiration Date:   09/21/2019    Order Specific Question:   Reason for Exam (SYMPTOM  OR DIAGNOSIS REQUIRED)    Answer:   wrist pain    Order Specific Question:   Preferred imaging location?    Answer:   Internal    Order Specific Question:   Radiology Contrast Protocol - do NOT remove file path    Answer:   [email or web address]  . Ambulatory referral to Gastroenterology    Referral Priority:   Routine    Referral Type:   Consultation    Referral Reason:   Specialty Services Required    Referred to Provider:   Jerene Bears, MD    Number of Visits Requested:   [place], MD [facility] 07/20/2018, 4:35 PM

## 2018-07-21 ENCOUNTER — Encounter: Payer: Self-pay | Admitting: Physician Assistant

## 2018-08-10 ENCOUNTER — Ambulatory Visit: Payer: Managed Care, Other (non HMO) | Admitting: Physician Assistant

## 2018-08-25 ENCOUNTER — Ambulatory Visit: Payer: Managed Care, Other (non HMO) | Admitting: Physician Assistant

## 2018-08-25 ENCOUNTER — Encounter: Payer: Self-pay | Admitting: Physician Assistant

## 2018-08-25 VITALS — BP 122/90 | HR 88 | Ht 72.0 in | Wt 228.5 lb

## 2018-08-25 DIAGNOSIS — K602 Anal fissure, unspecified: Secondary | ICD-10-CM

## 2018-08-25 DIAGNOSIS — K6289 Other specified diseases of anus and rectum: Secondary | ICD-10-CM | POA: Diagnosis not present

## 2018-08-25 DIAGNOSIS — K625 Hemorrhage of anus and rectum: Secondary | ICD-10-CM

## 2018-08-25 MED ORDER — NYSTATIN-TRIAMCINOLONE 100000-0.1 UNIT/GM-% EX OINT: 1.0000 "application " | TOPICAL_OINTMENT | Freq: Two times a day (BID) | CUTANEOUS | 0 refills | Status: DC

## 2018-08-25 MED ORDER — AMBULATORY NON FORMULARY MEDICATION: 0 refills | Status: DC

## 2018-08-25 NOTE — Patient Instructions (Signed)
We have sent the following medications to your pharmacy for you to pick up at your convenience: Ntiroglycerin ointment Mycolog ointment  Please take printed prescriptions to [facility].  Do Sitz baths 15-20 minutes three times daily.  Handout given.  Call in two weeks.  If still seeing blood, will need a colonoscopy.  Thank you for choosing me and [facility].   Ellouise Newer, PA-C

## 2018-08-25 NOTE — Progress Notes (Addendum)
Chief Complaint: Rectal bleeding  HPI:    James Blackburn is a 42 year old male, known to Dr. Hilarie Fredrickson, who presents to clinic today with a complaint of rectal bleeding.    09/07/2011 office visit with Dr. Hilarie Fredrickson to discuss of bright red blood per rectum.  At that time rectal exam with no hemorrhoid or fissure.  It was recommended he have a flex sigmoidoscopy for further evaluation.  The patient never had this done.    Today, patient returns to clinic and explains that he has the same symptoms as before which are intermittent rectal bleeding about 85% of the time.  Also with pain with bowel movements, he describes this as a sharp stabbing pain which occurs most of the time as well.  His wife describes this as a lot of blood, "it looks like my period".  Patient tells me there has been no change since being seen last in office and it has just continued but now he has new insurance which will hopefully pay for a colonoscopy.    Denies fever, chills, change in bowel habits, weight loss, anorexia, nausea, vomiting, heartburn or reflux.  Past Medical History:  Diagnosis Date  . Anal fissure 2008   per Dr Day  . H/O hiatal hernia   . Hyperlipidemia   . Hypertension   . Hypothyroidism   . Nephrolithiasis     Past Surgical History:  Procedure Laterality Date  . TONSILLECTOMY    . VASECTOMY      Current Outpatient Medications  Medication Sig Dispense Refill  . levothyroxine (SYNTHROID, LEVOTHROID) 75 MCG tablet Take 1 tablet (75 mcg total) by mouth daily. 90 tablet 3  . lisinopril (PRINIVIL,ZESTRIL) 20 MG tablet Take 1 tablet (20 mg total) by mouth daily. 90 tablet 3   No current facility-administered medications for this visit.     Allergies as of 08/25/2018 - Review Complete 08/25/2018  Allergen Reaction Noted  . Amoxicillin Other (See Comments) 09/07/2011  . Entex t [pseudoephedrine-guaifenesin] Other (See Comments) 05/23/2013  . Fenofibrate Other (See Comments) 05/23/2013    Family  History  Problem Relation Age of Onset  . COPD Mother        Deceased, 20  . Pneumonia Father        Deceased, late 39s  . Healthy Sister        x 2  . Healthy Son   . Healthy Daughter   . Hypertension Paternal Grandfather     Social History   Socioeconomic History  . Marital status: Married    Spouse name: Not on file  . Number of children: 2  . Years of education: Not on file  . Highest education level: Not on file  Occupational History  . 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 Financial resource strain: Not on file  . Food insecurity:    Worry: Not on file    Inability: Not on file  . Transportation needs:    Medical: Not on file    Non-medical: Not on file  Tobacco Use  . Smoking status: Never Smoker  . Smokeless tobacco: Never Used  Substance and Sexual Activity  . Alcohol use: No  . Drug use: No  . Sexual activity: Not on file  Lifestyle  . Physical activity:    Days per week: Not on file    Minutes per session: Not on file  . Stress: Not on file  Relationships  . Social connections:  Talks on phone: Not on file    Gets together: Not on file    Attends religious service: Not on file    Active member of club or organization: Not on file    Attends meetings of clubs or organizations: Not on file    Relationship status: Not on file  . Intimate partner violence:    Fear of current or ex partner: Not on file    Emotionally abused: Not on file    Physically abused: Not on file    Forced sexual activity: Not on file  Other Topics Concern  . Not on file  Social History Narrative   3 caffeine drinks daily    Lives with wife and children in one story home.   High school education.   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    Review of Systems:    Constitutional: No weight loss, fever or chills Skin: No rash Cardiovascular: No chest pain Respiratory: No SOB  Gastrointestinal: See HPI and otherwise negative Genitourinary: No  dysuria  Neurological: No headache, dizziness or syncope Musculoskeletal: No new muscle or joint pain Hematologic: No bruising Psychiatric: No history of depression or anxiety   Physical Exam:  Vital signs: BP 122/90 (BP Location: Left Arm, Patient Position: Sitting, Cuff Size: Normal)   Pulse 88   Ht 6' (1.829 m) Comment: height measured without shoes  Wt 228 lb 8 oz (103.6 kg)   BMI 30.99 kg/m   Constitutional:   Pleasant Caucasian male appears to be in NAD, Well developed, Well nourished, alert and cooperative Head:  Normocephalic and atraumatic. Eyes:   PEERL, EOMI. No icterus. Conjunctiva pink. Ears:  Normal auditory acuity. Neck:  Supple Throat: Oral cavity and pharynx without inflammation, swelling or lesion.  Respiratory: Respirations even and unlabored. Lungs clear to auscultation bilaterally.   No wheezes, crackles, or rhonchi.  Cardiovascular: Normal S1, S2. No MRG. Regular rate and rhythm. No peripheral edema, cyanosis or pallor.  Gastrointestinal:  Soft, nondistended, nontender. No rebound or guarding. Normal bowel sounds. No appreciable masses or hepatomegaly. Rectal: External: Perianal dermatitis, posterior fissure; internal exam: No mass Msk:  Symmetrical without gross deformities. Without edema, no deformity or joint abnormality.  Neurologic:  Alert and  oriented x4;  grossly normal neurologically.  Skin:   Dry and intact without significant lesions or rashes. Psychiatric:  Demonstrates good judgement and reason without abnormal affect or behaviors.  MOST RECENT LABS AND IMAGING: CBC    Component Value Date/Time   WBC 6.6 12/30/2017 1153   RBC 5.63 12/30/2017 1153   HGB 17.3 12/30/2017 1153   HCT 49.3 12/30/2017 1153   PLT 275 12/30/2017 1153   MCV 88 12/30/2017 1153   MCH 30.7 12/30/2017 1153   MCHC 35.1 12/30/2017 1153   RDW 14.0 12/30/2017 1153   LYMPHSABS 2.8 12/30/2017 1153   EOSABS 0.2 12/30/2017 1153   BASOSABS 0.0 12/30/2017 1153    CMP       Component Value Date/Time   NA 141 12/30/2017 1153   K 4.2 12/30/2017 1153   CL 104 12/30/2017 1153   CO2 21 12/30/2017 1153   GLUCOSE 89 12/30/2017 1153   BUN 10 12/30/2017 1153   CREATININE 0.76 12/30/2017 1153   CALCIUM 9.4 12/30/2017 1153   PROT 7.5 02/11/2018 0958   ALBUMIN 4.6 02/11/2018 0958   AST 41 (H) 02/11/2018 0958   ALT 67 (H) 02/11/2018 0958   ALKPHOS 86 02/11/2018 0958   BILITOT 0.5 02/11/2018 0958   GFRNONAA 113  12/30/2017 1153   GFRAA 131 12/30/2017 1153    Assessment: 1.  Rectal bleeding: 85% of the time per patient over the past 9-10 years, at time of last exam 2012 there was no fissure or hemorrhoids seen, today there is an anal fissure, likely this is the cause of bleeding 2.  Anal fissure: Posterior seen at time of exam today 3. Perianal dermatitis  Plan: 1.  Discussed with patient that his bleeding could be from just his fissure.  He would like to try treating this first to see if his symptoms go away. 2.  Prescribed Nitroglycerin ointment 3 times daily x6-8 weeks. 3.  Recommend sitz baths 15-20 minutes 2-3 times a day 4. Mycolog ointement BID to perianal area x2 weeks 5.  Discussed with patient that if his symptoms do not get any better over the next 2-3 weeks he should call our office.   At that point would recommend he have a colonoscopy for further evaluation in the [place] with Dr. Bethena Midget, PA-C [facility] 08/25/2018, 3:22 PM  Cc: Timmothy Euler, MD   Addendum: Reviewed and agree with initial management. I saw the patient in 2012 and recommended flexible sigmoidoscopy which was never performed. Given intermittent bleeding over many years, I doubt ominous pathology, but colonoscopy is recommended to exclude other pathology such as colitis. Pyrtle, Lajuan Lines, MD

## 2018-09-06 ENCOUNTER — Telehealth: Payer: Self-pay

## 2018-09-06 NOTE — Telephone Encounter (Signed)
Patient returned phone call. Dr. Hilarie Fredrickson does not have any colonscopy appointments left and November schedule is not available. I informed patient that I will call him when the November schedule does become available.

## 2018-09-06 NOTE — Telephone Encounter (Signed)
Unable to reach pt and no voice mail

## 2018-09-06 NOTE — Telephone Encounter (Signed)
Levin Erp, [place]  Timothy Lasso, RN        Please see how the patient is doing, but Dr. Hilarie Fredrickson recommends colonoscopy for further eval. Thanks-JLL

## 2018-09-06 NOTE — Telephone Encounter (Signed)
Message sent to the pt via My Chart to call and set up appt for colon

## 2018-09-06 NOTE — Telephone Encounter (Signed)
Left message on machine to call back  

## 2018-09-19 ENCOUNTER — Encounter: Payer: Self-pay | Admitting: Internal Medicine

## 2018-10-21 ENCOUNTER — Encounter: Payer: Self-pay | Admitting: Internal Medicine

## 2018-10-21 ENCOUNTER — Ambulatory Visit (AMBULATORY_SURGERY_CENTER): Payer: Self-pay

## 2018-10-21 VITALS — Ht 73.0 in | Wt 230.6 lb

## 2018-10-21 DIAGNOSIS — K625 Hemorrhage of anus and rectum: Secondary | ICD-10-CM

## 2018-10-21 MED ORDER — NA SULFATE-K SULFATE-MG SULF 17.5-3.13-1.6 GM/177ML PO SOLN: 1.0000 | Freq: Once | ORAL | 0 refills | Status: AC

## 2018-10-21 NOTE — Progress Notes (Signed)
Denies allergies to eggs or soy products. Denies complication of anesthesia or sedation. Denies use of weight loss medication. Denies use of O2.   Emmi instructions declined.  

## 2018-11-04 ENCOUNTER — Ambulatory Visit (AMBULATORY_SURGERY_CENTER): Payer: Managed Care, Other (non HMO) | Admitting: Internal Medicine

## 2018-11-04 ENCOUNTER — Encounter: Payer: Self-pay | Admitting: Internal Medicine

## 2018-11-04 VITALS — BP 119/82 | HR 72 | Temp 98.2°F | Resp 14 | Ht 73.0 in | Wt 230.0 lb

## 2018-11-04 DIAGNOSIS — K625 Hemorrhage of anus and rectum: Secondary | ICD-10-CM

## 2018-11-04 DIAGNOSIS — K635 Polyp of colon: Secondary | ICD-10-CM

## 2018-11-04 DIAGNOSIS — D125 Benign neoplasm of sigmoid colon: Secondary | ICD-10-CM

## 2018-11-04 DIAGNOSIS — D122 Benign neoplasm of ascending colon: Secondary | ICD-10-CM | POA: Diagnosis not present

## 2018-11-04 MED ORDER — SODIUM CHLORIDE 0.9 % IV SOLN: 500.0000 mL | Freq: Once | INTRAVENOUS | Status: DC

## 2018-11-04 NOTE — Op Note (Signed)
[place] Patient Name: James Blackburn Procedure Date: 11/04/2018 11:32 AM MRN: [id number] Endoscopist: Jerene Bears , MD Age: 42 Referring MD:  Date of Birth: [birth year] Gender: Male Account #: [id number] Procedure:                Colonoscopy Indications:              Rectal bleeding Medicines:                Monitored Anesthesia Care Procedure:                Pre-Anesthesia Assessment:                           - Prior to the procedure, a History and Physical                            was performed, and patient medications and                            allergies were reviewed. The patient's tolerance of                            previous anesthesia was also reviewed. The risks                            and benefits of the procedure and the sedation                            options and risks were discussed with the patient.                            All questions were answered, and informed consent                            was obtained. Prior Anticoagulants: The patient has                            taken no previous anticoagulant or antiplatelet                            agents. ASA Grade Assessment: II - A patient with                            mild systemic disease. After reviewing the risks                            and benefits, the patient was deemed in                            satisfactory condition to undergo the procedure.                           After obtaining informed consent, the colonoscope  was passed under direct vision. Throughout the                            procedure, the patient's blood pressure, pulse, and                            oxygen saturations were monitored continuously. The                            Colonoscope was introduced through the anus and                            advanced to the cecum, identified by appendiceal                            orifice and ileocecal valve. The colonoscopy  was                            performed without difficulty. The patient tolerated                            the procedure well. The quality of the bowel                            preparation was good. The ileocecal valve,                            appendiceal orifice, and rectum were photographed. Scope In: 11:44:25 AM Scope Out: 11:55:00 AM Scope Withdrawal Time: 0 hours 9 minutes 23 seconds  Total Procedure Duration: 0 hours 10 minutes 35 seconds  Findings:                 Hemorrhoids were found on perianal exam.                           Two sessile polyps were found in the ascending                            colon. The polyps were 3 to 5 mm in size. These                            polyps were removed with a cold snare. Resection                            and retrieval were complete.                           A 4 mm polyp was found in the sigmoid colon. The                            polyp was sessile. The polyp was removed with a  cold snare. Resection and retrieval were complete.                           Internal hemorrhoids were found during                            retroflexion, during perianal exam and during                            digital exam. The hemorrhoids were small. Complications:            No immediate complications. Estimated Blood Loss:     Estimated blood loss was minimal. Impression:               - Two 3 to 5 mm polyps in the ascending colon,                            removed with a cold snare. Resected and retrieved.                           - One 4 mm polyp in the sigmoid colon, removed with                            a cold snare. Resected and retrieved.                           - Internal hemorrhoids. Recommendation:           - Patient has a contact number available for                            emergencies. The signs and symptoms of potential                            delayed complications were discussed with the                             patient. Return to normal activities tomorrow.                            Written discharge instructions were provided to the                            patient.                           - Resume previous diet.                           - Continue present medications.                           - Await pathology results.                           - Repeat colonoscopy is recommended for  surveillance. The colonoscopy date will be                            determined after pathology results from today's                            exam become available for review.                           - Hemorrhoidal banding is an option for recurrent                            rectal bleeding. If you are interested please                            contact my office for appointment. Jerene Bears, MD 11/04/2018 12:00:02 PM This report has been signed electronically.

## 2018-11-04 NOTE — Progress Notes (Signed)
PT taken to PACU. Monitors in place. VSS. Report given to RN. 

## 2018-11-04 NOTE — Progress Notes (Signed)
Pt's states no medical or surgical changes since previsit or office visit. 

## 2018-11-04 NOTE — Progress Notes (Signed)
Called to room to assist during endoscopic procedure.  Patient ID and intended procedure confirmed with present staff. Received instructions for my participation in the procedure from the performing physician.  

## 2018-11-04 NOTE — Patient Instructions (Signed)
Handouts given on polyps, hemorrhoids and banding.  YOU HAD AN ENDOSCOPIC PROCEDURE TODAY AT [facility]:   Refer to the procedure report that was given to you for any specific questions about what was found during the examination.  If the procedure report does not answer your questions, please call your gastroenterologist to clarify.  If you requested that your care partner not be given the details of your procedure findings, then the procedure report has been included in a sealed envelope for you to review at your convenience later.  YOU SHOULD EXPECT: Some feelings of bloating in the abdomen. Passage of more gas than usual.  Walking can help get rid of the air that was put into your GI tract during the procedure and reduce the bloating. If you had a lower endoscopy (such as a colonoscopy or flexible sigmoidoscopy) you may notice spotting of blood in your stool or on the toilet paper. If you underwent a bowel prep for your procedure, you may not have a normal bowel movement for a few days.  Please Note:  You might notice some irritation and congestion in your nose or some drainage.  This is from the oxygen used during your procedure.  There is no need for concern and it should clear up in a day or so.  SYMPTOMS TO REPORT IMMEDIATELY:   Following lower endoscopy (colonoscopy or flexible sigmoidoscopy):  Excessive amounts of blood in the stool  Significant tenderness or worsening of abdominal pains  Swelling of the abdomen that is new, acute  Fever of 100F or higher For urgent or emergent issues, a gastroenterologist can be reached at any hour by calling [phone].   DIET:  We do recommend a small meal at first, but then you may proceed to your regular diet.  Drink plenty of fluids but you should avoid alcoholic beverages for 24 hours.  ACTIVITY:  You should plan to take it easy for the rest of today and you should NOT DRIVE or use heavy machinery until tomorrow (because  of the sedation medicines used during the test).    FOLLOW UP: Our staff will call the number listed on your records the next business day following your procedure to check on you and address any questions or concerns that you may have regarding the information given to you following your procedure. If we do not reach you, we will leave a message.  However, if you are feeling well and you are not experiencing any problems, there is no need to return our call.  We will assume that you have returned to your regular daily activities without incident.  If any biopsies were taken you will be contacted by phone or by letter within the next 1-3 weeks.  Please call us at [phone] if you have not heard about the biopsies in 3 weeks.    SIGNATURES/CONFIDENTIALITY: You and/or your care partner have signed paperwork which will be entered into your electronic medical record.  These signatures attest to the fact that that the information above on your After Visit Summary has been reviewed and is understood.  Full responsibility of the confidentiality of this discharge information lies with you and/or your care-partner.

## 2018-11-07 ENCOUNTER — Telehealth: Payer: Self-pay | Admitting: *Deleted

## 2018-11-07 NOTE — Telephone Encounter (Signed)
  Follow up Call-  Call back number 11/04/2018  Post procedure Call Back phone  # [phone] cell  Permission to leave phone message Yes  Some recent data might be hidden     Patient questions:  Do you have a fever, pain , or abdominal swelling? No. Pain Score  0 *  Have you tolerated food without any problems? Yes.    Have you been able to return to your normal activities? Yes.    Do you have any questions about your discharge instructions: Diet   No. Medications  No. Follow up visit  No.  Do you have questions or concerns about your Care? No.  Actions: * If pain score is 4 or above: No action needed, pain <4.

## 2018-11-14 ENCOUNTER — Encounter: Payer: Self-pay | Admitting: Internal Medicine

## 2019-01-24 ENCOUNTER — Other Ambulatory Visit: Payer: Self-pay | Admitting: *Deleted

## 2019-01-24 DIAGNOSIS — I10 Essential (primary) hypertension: Secondary | ICD-10-CM

## 2019-01-24 MED ORDER — LISINOPRIL 20 MG PO TABS: 20.0000 mg | ORAL_TABLET | Freq: Every day | ORAL | 0 refills | Status: DC

## 2019-02-17 ENCOUNTER — Encounter: Payer: Self-pay | Admitting: Physician Assistant

## 2019-02-17 ENCOUNTER — Ambulatory Visit: Payer: Managed Care, Other (non HMO) | Admitting: Physician Assistant

## 2019-02-17 VITALS — BP 145/98 | HR 95 | Temp 98.9°F | Ht 73.0 in | Wt 234.2 lb

## 2019-02-17 DIAGNOSIS — J40 Bronchitis, not specified as acute or chronic: Secondary | ICD-10-CM

## 2019-02-17 DIAGNOSIS — R059 Cough, unspecified: Secondary | ICD-10-CM

## 2019-02-17 DIAGNOSIS — R05 Cough: Secondary | ICD-10-CM | POA: Diagnosis not present

## 2019-02-17 MED ORDER — AZITHROMYCIN 250 MG PO TABS: ORAL_TABLET | ORAL | 0 refills | Status: DC

## 2019-02-17 MED ORDER — PREDNISONE 10 MG (21) PO TBPK: ORAL_TABLET | ORAL | 0 refills | Status: DC

## 2019-02-17 MED ORDER — HYDROCODONE-HOMATROPINE 5-1.5 MG/5ML PO SYRP: 5.0000 mL | ORAL_SOLUTION | Freq: Four times a day (QID) | ORAL | 0 refills | Status: DC | PRN

## 2019-02-17 NOTE — Progress Notes (Signed)
BP (!) 145/98   Pulse 95   Temp 98.9 F (37.2 C) (Oral)   Ht 6\' 1"  (1.854 m)   Wt 234 lb 3.2 oz (106.2 kg)   BMI 30.90 kg/m    Subjective:    Patient ID: James Blackburn, male    DOB: [birth year], 43 y.o.   MRN: [id number]  HPI: James Blackburn is a 43 y.o. male presenting on 02/17/2019 for Cough and Sore Throat  Patient with several days of progressing upper respiratory and bronchial symptoms. Initially there was more upper respiratory congestion. This progressed to having significant cough that is productive throughout the day and severe at night. There is occasional wheezing after coughing. Sometimes there is slight dyspnea on exertion. It is productive mucus that is yellow in color. Denies any blood.   Past Medical History:  Diagnosis Date  . Anal fissure 2008   per Dr Day  . H/O hiatal hernia   . Hyperlipidemia   . Hypertension   . Hypothyroidism   . Nephrolithiasis    Relevant past medical, surgical, family and social history reviewed and updated as indicated. Interim medical history since our last visit reviewed. Allergies and medications reviewed and updated. DATA REVIEWED: CHART IN EPIC  Family History reviewed for pertinent findings.  Review of Systems  Constitutional: Positive for fatigue. Negative for appetite change.  HENT: Positive for sinus pressure and sore throat.   Eyes: Negative.  Negative for pain and visual disturbance.  Respiratory: Positive for shortness of breath and wheezing. Negative for cough and chest tightness.   Cardiovascular: Negative.  Negative for chest pain, palpitations and leg swelling.  Gastrointestinal: Negative.  Negative for abdominal pain, diarrhea, nausea and vomiting.  Endocrine: Negative.   Genitourinary: Negative.   Musculoskeletal: Positive for back pain and myalgias.  Skin: Negative.  Negative for color change and rash.  Neurological: Positive for headaches. Negative for weakness and numbness.    Psychiatric/Behavioral: Negative.     Allergies as of 02/17/2019      Reactions   Amoxicillin Other (See Comments)   Breathing difficulty   Entex T [pseudoephedrine-guaifenesin] Other (See Comments)   "Makes my legs ache"   Fenofibrate Other (See Comments)   myalgias      Medication List       Accurate as of February 17, 2019  3:51 PM. Always use your most recent med list.        azithromycin 250 MG tablet Commonly known as:  ZITHROMAX Z-PAK Take as directed   HYDROcodone-homatropine 5-1.5 MG/5ML syrup Commonly known as:  HYCODAN Take 5-10 mLs by mouth every 6 (six) hours as needed.   levothyroxine 75 MCG tablet Commonly known as:  SYNTHROID, LEVOTHROID Take 1 tablet (75 mcg total) by mouth daily.   lisinopril 20 MG tablet Commonly known as:  PRINIVIL,ZESTRIL Take 1 tablet (20 mg total) by mouth daily. (Needs to be seen before next refill)   predniSONE 10 MG (21) Tbpk tablet Commonly known as:  STERAPRED UNI-PAK 21 TAB As directed x 6 days          Objective:    BP (!) 145/98   Pulse 95   Temp 98.9 F (37.2 C) (Oral)   Ht 6\' 1"  (1.854 m)   Wt 234 lb 3.2 oz (106.2 kg)   BMI 30.90 kg/m   Allergies  Allergen Reactions  . Amoxicillin Other (See Comments)    Breathing difficulty  . Entex T [Pseudoephedrine-Guaifenesin] Other (See Comments)    "Makes my  legs ache"  . Fenofibrate Other (See Comments)    myalgias    Wt Readings from Last 3 Encounters:  02/17/19 234 lb 3.2 oz (106.2 kg)  11/04/18 230 lb (104.3 kg)  10/21/18 230 lb 9.6 oz (104.6 kg)    Physical Exam Constitutional:      Appearance: He is well-developed.  HENT:     Head: Normocephalic and atraumatic.     Right Ear: Hearing and tympanic membrane normal.     Left Ear: Hearing and tympanic membrane normal.     Nose: Mucosal edema present. No nasal deformity.     Right Sinus: Frontal sinus tenderness present.     Left Sinus: Frontal sinus tenderness present.     Mouth/Throat:      Pharynx: Posterior oropharyngeal erythema present.  Eyes:     General:        Right eye: No discharge.        Left eye: No discharge.     Conjunctiva/sclera: Conjunctivae normal.     Pupils: Pupils are equal, round, and reactive to light.  Neck:     Musculoskeletal: Normal range of motion and neck supple.  Cardiovascular:     Rate and Rhythm: Normal rate and regular rhythm.     Heart sounds: Normal heart sounds.  Pulmonary:     Effort: Pulmonary effort is normal. No respiratory distress.     Breath sounds: Wheezing present. No decreased breath sounds, rhonchi or rales.  Abdominal:     General: Bowel sounds are normal.     Palpations: Abdomen is soft.  Musculoskeletal: Normal range of motion.  Skin:    General: Skin is warm and dry.     Results for orders placed or performed in visit on 02/11/18  Hepatic function panel  Result Value Ref Range   Total Protein 7.5 6.0 - 8.5 g/dL   Albumin 4.6 3.5 - 5.5 g/dL   Bilirubin Total 0.5 0.0 - 1.2 mg/dL   Bilirubin, Direct 0.13 0.00 - 0.40 mg/dL   Alkaline Phosphatase 86 39 - 117 IU/L   AST 41 (H) 0 - 40 IU/L   ALT 67 (H) 0 - 44 IU/L  Lipid panel  Result Value Ref Range   Cholesterol, Total 175 100 - 199 mg/dL   Triglycerides 177 (H) 0 - 149 mg/dL   HDL 31 (L) >39 mg/dL   VLDL Cholesterol Cal 35 5 - 40 mg/dL   LDL Calculated 109 (H) 0 - 99 mg/dL   Chol/HDL Ratio 5.6 (H) 0.0 - 5.0 ratio      Assessment & Plan:   1. Bronchitis - azithromycin (ZITHROMAX Z-PAK) 250 MG tablet; Take as directed  Dispense: 6 each; Refill: 0 - predniSONE (STERAPRED UNI-PAK 21 TAB) 10 MG (21) TBPK tablet; As directed x 6 days  Dispense: 21 tablet; Refill: 0 - HYDROcodone-homatropine (HYCODAN) 5-1.5 MG/5ML syrup; Take 5-10 mLs by mouth every 6 (six) hours as needed.  Dispense: 240 mL; Refill: 0  2. Cough - HYDROcodone-homatropine (HYCODAN) 5-1.5 MG/5ML syrup; Take 5-10 mLs by mouth every 6 (six) hours as needed.  Dispense: 240 mL; Refill:  0   Continue all other maintenance medications as listed above.  Follow up plan: No follow-ups on file.  Educational handout given for [place] PA-C [facility] [address]  [place], [place] [address] [phone]   02/17/2019, 3:51 PM

## 2019-02-20 ENCOUNTER — Other Ambulatory Visit: Payer: Self-pay | Admitting: Physician Assistant

## 2019-02-20 DIAGNOSIS — I10 Essential (primary) hypertension: Secondary | ICD-10-CM

## 2019-02-20 DIAGNOSIS — E039 Hypothyroidism, unspecified: Secondary | ICD-10-CM

## 2019-02-20 MED ORDER — LISINOPRIL 20 MG PO TABS: 20.0000 mg | ORAL_TABLET | Freq: Every day | ORAL | 0 refills | Status: DC

## 2019-02-20 MED ORDER — LEVOTHYROXINE SODIUM 75 MCG PO TABS: 75.0000 ug | ORAL_TABLET | Freq: Every day | ORAL | 0 refills | Status: DC

## 2019-02-20 NOTE — Telephone Encounter (Signed)
Talked w/ wife, pt NTBS & have labs for thyroid done since it's been over a year. Pt will call back to make the appt due to his schedule Refill will be sent when appt is set

## 2019-02-20 NOTE — Telephone Encounter (Signed)
Refills for 30 days sent to pharmacy

## 2019-03-19 ENCOUNTER — Other Ambulatory Visit: Payer: Self-pay | Admitting: Physician Assistant

## 2019-03-19 DIAGNOSIS — E039 Hypothyroidism, unspecified: Secondary | ICD-10-CM

## 2019-03-21 ENCOUNTER — Other Ambulatory Visit: Payer: Self-pay

## 2019-03-21 ENCOUNTER — Encounter: Payer: Self-pay | Admitting: Physician Assistant

## 2019-03-21 ENCOUNTER — Ambulatory Visit: Payer: Managed Care, Other (non HMO) | Admitting: Physician Assistant

## 2019-03-21 VITALS — BP 136/89 | HR 104 | Temp 98.4°F | Ht 73.0 in | Wt 232.6 lb

## 2019-03-21 DIAGNOSIS — E039 Hypothyroidism, unspecified: Secondary | ICD-10-CM

## 2019-03-21 DIAGNOSIS — E7800 Pure hypercholesterolemia, unspecified: Secondary | ICD-10-CM | POA: Diagnosis not present

## 2019-03-21 DIAGNOSIS — Z0000 Encounter for general adult medical examination without abnormal findings: Secondary | ICD-10-CM

## 2019-03-21 NOTE — Progress Notes (Signed)
BP 136/89   Pulse (!) 104   Temp 98.4 F (36.9 C) (Oral)   Ht _0  (1.854 m)   Wt 232 lb 9.6 oz (105.5 kg)   BMI 30.69 kg/m    Subjective:    Patient ID: James Blackburn, male    DOB: June 06, 1976, 43 y.o.   MRN: [id number]  HPI: James Blackburn is a 43 y.o. male presenting on 03/21/2019 for Hypothyroidism and Hypertension  He comes in for recheck on his medical conditions of hypothyroidism, hypertension and hyperlipidemia.  He will be out of medication in a couple of days.  He has been feeling well and no new complaints at this time.  Annual labs are ordered today and ordered and well visit.  He had trouble taking atorvastatin and fenofibrate   Past Medical History:  Diagnosis Date  . Anal fissure 2008   per Dr Day  . H/O hiatal hernia   . Hyperlipidemia   . Hypertension   . Hypothyroidism   . Nephrolithiasis    Relevant past medical, surgical, family and social history reviewed and updated as indicated. Interim medical history since our last visit reviewed. Allergies and medications reviewed and updated. DATA REVIEWED: CHART IN EPIC  Family History reviewed for pertinent findings.  Review of Systems  Constitutional: Negative.  Negative for appetite change and fatigue.  Eyes: Negative for pain and visual disturbance.  Respiratory: Negative.  Negative for cough, chest tightness, shortness of breath and wheezing.   Cardiovascular: Negative.  Negative for chest pain, palpitations and leg swelling.  Gastrointestinal: Negative.  Negative for abdominal pain, diarrhea, nausea and vomiting.  Genitourinary: Negative.   Skin: Negative.  Negative for color change and rash.  Neurological: Negative.  Negative for weakness, numbness and headaches.  Psychiatric/Behavioral: Negative.     Allergies as of 03/21/2019      Reactions   Amoxicillin Other (See Comments)   Breathing difficulty   Entex T [pseudoephedrine-guaifenesin] Other (See Comments)   "Makes my legs ache"   Fenofibrate Other (See Comments)   myalgias      Medication List       Accurate as of March 21, 2019  6:50 PM. Always use your most recent med list.        levothyroxine 75 MCG tablet Commonly known as:  SYNTHROID, LEVOTHROID TAKE ONE TABLET BY MOUTH DAILY   lisinopril 20 MG tablet Commonly known as:  PRINIVIL,ZESTRIL Take 1 tablet (20 mg total) by mouth daily.          Objective:    BP 136/89   Pulse (!) 104   Temp 98.4 F (36.9 C) (Oral)   Ht _1  (1.854 m)   Wt 232 lb 9.6 oz (105.5 kg)   BMI 30.69 kg/m   Allergies  Allergen Reactions  . Amoxicillin Other (See Comments)    Breathing difficulty  . Entex T [Pseudoephedrine-Guaifenesin] Other (See Comments)    "Makes my legs ache"  . Fenofibrate Other (See Comments)    myalgias    Wt Readings from Last 3 Encounters:  03/21/19 232 lb 9.6 oz (105.5 kg)  02/17/19 234 lb 3.2 oz (106.2 kg)  11/04/18 230 lb (104.3 kg)    Physical Exam Vitals signs and nursing note reviewed.  Constitutional:      General: He is not in acute distress.    Appearance: He is well-developed.  HENT:     Head: Normocephalic and atraumatic.  Eyes:     Conjunctiva/sclera: Conjunctivae normal.  Pupils: Pupils are equal, round, and reactive to light.  Cardiovascular:     Rate and Rhythm: Normal rate and regular rhythm.     Heart sounds: Normal heart sounds.  Pulmonary:     Effort: Pulmonary effort is normal. No respiratory distress.     Breath sounds: Normal breath sounds.  Skin:    General: Skin is warm and dry.  Psychiatric:        Behavior: Behavior normal.         Assessment & Plan:   1. Well adult exam - CBC with Differential/Platelet - Lipid panel - TSH - CMP14+EGFR  2. Hypothyroidism, unspecified type Continue medication Labs today  3. Pure hypercholesterolemia Labs checked today, stopped atorvastatin and fenofibrate  Continue all other maintenance medications as listed above.  Follow up plan: Return  in about 6 months (around 09/21/2019) for recheck.  Educational handout given for [place] PA-C [facility] [address]  [place], [place] [address] [phone]   03/21/2019, 6:50 PM

## 2019-03-22 ENCOUNTER — Other Ambulatory Visit: Payer: Self-pay | Admitting: Physician Assistant

## 2019-03-22 ENCOUNTER — Telehealth: Payer: Self-pay | Admitting: Physician Assistant

## 2019-03-22 ENCOUNTER — Ambulatory Visit: Payer: Managed Care, Other (non HMO) | Admitting: Physician Assistant

## 2019-03-22 DIAGNOSIS — I10 Essential (primary) hypertension: Secondary | ICD-10-CM

## 2019-03-22 DIAGNOSIS — E039 Hypothyroidism, unspecified: Secondary | ICD-10-CM

## 2019-03-22 LAB — CBC WITH DIFFERENTIAL/PLATELET
BASOS ABS: 0.1 10*3/uL (ref 0.0–0.2)
BASOS: 1 %
EOS (ABSOLUTE): 0.1 10*3/uL (ref 0.0–0.4)
Eos: 2 %
Hematocrit: 49.2 % (ref 37.5–51.0)
Hemoglobin: 16.6 g/dL (ref 13.0–17.7)
IMMATURE GRANS (ABS): 0 10*3/uL (ref 0.0–0.1)
IMMATURE GRANULOCYTES: 0 %
LYMPHS: 36 %
Lymphocytes Absolute: 2.5 10*3/uL (ref 0.7–3.1)
MCH: 29.6 pg (ref 26.6–33.0)
MCHC: 33.7 g/dL (ref 31.5–35.7)
MCV: 88 fL (ref 79–97)
MONOS ABS: 0.8 10*3/uL (ref 0.1–0.9)
Monocytes: 12 %
NEUTROS PCT: 49 %
Neutrophils Absolute: 3.4 10*3/uL (ref 1.4–7.0)
PLATELETS: 326 10*3/uL (ref 150–450)
RBC: 5.6 x10E6/uL (ref 4.14–5.80)
RDW: 13.4 % (ref 11.6–15.4)
WBC: 6.9 10*3/uL (ref 3.4–10.8)

## 2019-03-22 LAB — CMP14+EGFR
ALT: 69 IU/L — ABNORMAL HIGH (ref 0–44)
AST: 44 IU/L — ABNORMAL HIGH (ref 0–40)
Albumin/Globulin Ratio: 1.8 (ref 1.2–2.2)
Albumin: 4.7 g/dL (ref 4.0–5.0)
Alkaline Phosphatase: 84 IU/L (ref 39–117)
BUN / CREAT RATIO: 13 (ref 9–20)
BUN: 11 mg/dL (ref 6–24)
Bilirubin Total: 0.4 mg/dL (ref 0.0–1.2)
CO2: 21 mmol/L (ref 20–29)
Calcium: 9.4 mg/dL (ref 8.7–10.2)
Chloride: 102 mmol/L (ref 96–106)
Creatinine, Ser: 0.85 mg/dL (ref 0.76–1.27)
GFR calc Af Amer: 124 mL/min/{1.73_m2} (ref >59)
GFR calc non Af Amer: 107 mL/min/{1.73_m2} (ref >59)
Globulin, Total: 2.6 g/dL (ref 1.5–4.5)
Glucose: 94 mg/dL (ref 65–99)
Potassium: 4.2 mmol/L (ref 3.5–5.2)
Sodium: 139 mmol/L (ref 134–144)
Total Protein: 7.3 g/dL (ref 6.0–8.5)

## 2019-03-22 LAB — TSH: TSH: 2.04 u[IU]/mL (ref 0.450–4.500)

## 2019-03-22 LAB — LIPID PANEL
CHOLESTEROL TOTAL: 194 mg/dL (ref 100–199)
Chol/HDL Ratio: 7.8 ratio — ABNORMAL HIGH (ref 0.0–5.0)
HDL: 25 mg/dL — AB (ref >39)
LDL Calculated: 99 mg/dL (ref 0–99)
Triglycerides: 349 mg/dL — ABNORMAL HIGH (ref 0–149)
VLDL Cholesterol Cal: 70 mg/dL — ABNORMAL HIGH (ref 5–40)

## 2019-03-22 MED ORDER — LISINOPRIL 20 MG PO TABS: 20.0000 mg | ORAL_TABLET | Freq: Every day | ORAL | 11 refills | Status: DC

## 2019-03-22 MED ORDER — LEVOTHYROXINE SODIUM 75 MCG PO TABS: 75.0000 ug | ORAL_TABLET | Freq: Every day | ORAL | 11 refills | Status: DC

## 2019-03-22 NOTE — Telephone Encounter (Signed)
Returned patient's call for lab results.

## 2019-04-10 ENCOUNTER — Encounter: Payer: Self-pay | Admitting: Family Medicine

## 2019-04-10 ENCOUNTER — Other Ambulatory Visit: Payer: Self-pay

## 2019-04-10 ENCOUNTER — Ambulatory Visit (INDEPENDENT_AMBULATORY_CARE_PROVIDER_SITE_OTHER): Payer: Managed Care, Other (non HMO) | Admitting: Family Medicine

## 2019-04-10 ENCOUNTER — Telehealth: Payer: Self-pay | Admitting: Physician Assistant

## 2019-04-10 DIAGNOSIS — W57XXXA Bitten or stung by nonvenomous insect and other nonvenomous arthropods, initial encounter: Secondary | ICD-10-CM

## 2019-04-10 DIAGNOSIS — S60562A Insect bite (nonvenomous) of left hand, initial encounter: Secondary | ICD-10-CM | POA: Diagnosis not present

## 2019-04-10 MED ORDER — DOXYCYCLINE HYCLATE 100 MG PO TABS: 100.0000 mg | ORAL_TABLET | Freq: Two times a day (BID) | ORAL | 0 refills | Status: DC

## 2019-04-10 NOTE — Telephone Encounter (Signed)
Pt was bite by insect on Sat Currently taking Amoxicillin Pt having worsening sxs appt scheduled

## 2019-04-10 NOTE — Progress Notes (Signed)
Telephone visit  Subjective: CC: insect bite PCP: Terald Sleeper, PA-C [id number] James Blackburn is a 43 y.o. male calls for telephone consult today. Patient provides verbal consent for consult held via phone.  Location of patient: home Location of provider: [facility] Others present for call: none  1. Insect bite Patient reports that he sustained what he thinks may be a spider bite to the left base of his thumb on Saturday when he was [place] hunting.  He notes that this started as a red spot and then proceeded to become blackish and purpleish.  At onset he had associated fever, headache and vomiting but that has since resolved.  He was seen at the urgent care on Sunday and started on amoxicillin.  He had some sweating and this worried his wife and therefore he is reaching out for further instructions.  Not currently taking any ibuprofen, Aleve or Tylenol.  Pain is not significant now.  No sensation changes, purulence from wound or swelling.  Lesion is essentially stable.   ROS: Per HPI  Allergies  Allergen Reactions  . Entex T [Pseudoephedrine-Guaifenesin] Other (See Comments)    "Makes my legs ache"  . Fenofibrate Other (See Comments)    myalgias   Past Medical History:  Diagnosis Date  . Anal fissure 2008   per Dr Day  . H/O hiatal hernia   . Hyperlipidemia   . Hypertension   . Hypothyroidism   . Nephrolithiasis     Current Outpatient Medications:  .  levothyroxine (SYNTHROID, LEVOTHROID) 75 MCG tablet, Take 1 tablet (75 mcg total) by mouth daily., Disp: 30 tablet, Rfl: 11 .  lisinopril (PRINIVIL,ZESTRIL) 20 MG tablet, Take 1 tablet (20 mg total) by mouth daily., Disp: 30 tablet, Rfl: 11  Assessment/ Plan: 43 y.o. male   1. Insect bite of left hand, initial encounter We will broaden his antibiotics to encompass staphylococcal infection.  Continue amoxicillin.  I have added doxycycline p.o. twice daily.  Home care instructions reviewed with the patient reasons for emergent  evaluation emergency department discussed.  He voiced good understanding will follow-up PRN. - doxycycline (VIBRA-TABS) 100 MG tablet; Take 1 tablet (100 mg total) by mouth 2 (two) times daily.  Dispense: 20 tablet; Refill: 0   Start time: 2:35pm End time: 2:42pm  Total time spent on patient care (including telephone call/ virtual visit): 12 minutes  [place], [place] [phone]

## 2019-04-10 NOTE — Patient Instructions (Signed)
Spider Bite    Spider bites are not common. Most spider bites do not cause serious problems. There are only a few types of spider bites that can cause serious health problems.  Follow these instructions at home:  Medicine  · Take or apply over-the-counter and prescription medicines only as told by your doctor.  · If you were given an antibiotic medicine, take or apply it as told by your doctor. Do not stop using the antibiotic even if your condition improves.  General instructions  · Do not scratch the bite area.  · Keep the bite area clean and dry. Wash the bite area with soap and water every day as told by your doctor.  · If directed, apply ice to the bite area.  ? Put ice in a plastic bag.  ? Place a towel between your skin and the bag.  ? Leave the ice on for 20 minutes, 2-3 times per day.  · Raise (elevate) the affected area above the level of your heart while you are sitting or lying down, if this is possible.  · Keep all follow-up visits as told by your doctor. This is important.  Contact a doctor if:  · Your bite does not get better after 3 days.  · Your bite turns black or purple.  · Near the bite, you have:  ? Redness.  ? Swelling (inflammation).  ? Pain that is getting worse.  Get help right away if:  · You get shortness of breath or chest pain.  · You have fluid, blood, or pus coming from the bite area.  · You have muscle cramps or painful muscle spasms.  · You have stomach (abdominal) pain.  · You feel sick to your stomach (nauseous) or you throw up (vomit).  · You feel more tired or sleepy than you normally do.  This information is not intended to replace advice given to you by your health care provider. Make sure you discuss any questions you have with your health care provider.  Document Released: 01/16/2011 Document Revised: 08/10/2016 Document Reviewed: 05/01/2015  Elsevier Interactive Patient Education © 2019 [employer]

## 2019-05-19 ENCOUNTER — Other Ambulatory Visit: Payer: Self-pay

## 2019-05-19 ENCOUNTER — Encounter: Payer: Self-pay | Admitting: Family Medicine

## 2019-05-19 ENCOUNTER — Ambulatory Visit (INDEPENDENT_AMBULATORY_CARE_PROVIDER_SITE_OTHER): Payer: Managed Care, Other (non HMO) | Admitting: Family Medicine

## 2019-05-19 DIAGNOSIS — J020 Streptococcal pharyngitis: Secondary | ICD-10-CM

## 2019-05-19 MED ORDER — AMOXICILLIN-POT CLAVULANATE 875-125 MG PO TABS: 1.0000 | ORAL_TABLET | Freq: Two times a day (BID) | ORAL | 0 refills | Status: DC

## 2019-05-19 NOTE — Progress Notes (Signed)
    Subjective:    Patient ID: James Blackburn, male    DOB: 14-Apr-1976, 43 y.o.   MRN: [id number]   HPI: James Blackburn is a 43 y.o. male presenting for ST onset 2 days ago. HA and cold chills. T 100.9 Bad throat. Not coughing  .Missed work last night.  Depression screen [facility] 2/9 03/21/2019 07/20/2018 02/11/2018 12/30/2017 05/21/2017  Decreased Interest 0 0 0 0 0  Down, Depressed, Hopeless 0 0 0 0 0  PHQ - 2 Score 0 0 0 0 0     Relevant past medical, surgical, family and social history reviewed and updated as indicated.  Interim medical history since our last visit reviewed. Allergies and medications reviewed and updated.  ROS:  Review of Systems  Constitutional: Negative for activity change, appetite change, chills and fever.  HENT: Positive for congestion, rhinorrhea and sore throat. Negative for ear discharge, ear pain, hearing loss, nosebleeds, postnasal drip, sneezing and trouble swallowing.   Respiratory: Negative for chest tightness and shortness of breath.   Cardiovascular: Negative for chest pain and palpitations.  Skin: Negative for rash.     Social History   Tobacco Use  Smoking Status Never Smoker  Smokeless Tobacco Never Used       Objective:     Wt Readings from Last 3 Encounters:  03/21/19 232 lb 9.6 oz (105.5 kg)  02/17/19 234 lb 3.2 oz (106.2 kg)  11/04/18 230 lb (104.3 kg)    Video visit. Pt. Was able to focus camera on posterior oropharynx revealing exudate and tonsilar enlargement.  Otherwise exam deferred. Pt. Harboring due to COVID 19. Phone visit performed.   Assessment & Plan:   1. Strep pharyngitis     Meds ordered this encounter  Medications  . amoxicillin-clavulanate (AUGMENTIN) 875-125 MG tablet    Sig: Take 1 tablet by mouth 2 (two) times daily. Take all of this medication    Dispense:  20 tablet    Refill:  0    No orders of the defined types were placed in this encounter.     Diagnoses and all orders for this visit:   Strep pharyngitis  Other orders -     amoxicillin-clavulanate (AUGMENTIN) 875-125 MG tablet; Take 1 tablet by mouth 2 (two) times daily. Take all of this medication    Virtual Visit via telephone Note  I discussed the limitations, risks, security and privacy concerns of performing an evaluation and management service by telephone and the availability of in person appointments. The patient was identified with two identifiers. Pt.expressed understanding and agreed to proceed. Pt. Is at home. Dr. Livia Snellen is in his office.  Follow Up Instructions:   I discussed the assessment and treatment plan with the patient. The patient was provided an opportunity to ask questions and all were answered. The patient agreed with the plan and demonstrated an understanding of the instructions.   The patient was advised to call back or seek an in-person evaluation if the symptoms worsen or if the condition fails to improve as anticipated.   Total minutes including chart review and phone contact time: 10   Follow up plan: Return if symptoms worsen or fail to improve.  Claretta Fraise, MD [place]

## 2019-06-23 ENCOUNTER — Ambulatory Visit: Payer: Self-pay | Admitting: Physician Assistant

## 2019-09-21 ENCOUNTER — Other Ambulatory Visit: Payer: Self-pay

## 2019-09-21 DIAGNOSIS — Z20822 Contact with and (suspected) exposure to covid-19: Secondary | ICD-10-CM

## 2019-09-22 ENCOUNTER — Telehealth: Payer: Self-pay | Admitting: Physician Assistant

## 2019-09-22 LAB — NOVEL CORONAVIRUS, NAA: SARS-CoV-2, NAA: NOT DETECTED

## 2019-09-22 NOTE — Telephone Encounter (Signed)
Patient called and received his covid test result

## 2019-10-05 ENCOUNTER — Other Ambulatory Visit: Payer: Self-pay

## 2019-10-05 DIAGNOSIS — Z20822 Contact with and (suspected) exposure to covid-19: Secondary | ICD-10-CM

## 2019-10-06 LAB — NOVEL CORONAVIRUS, NAA: SARS-CoV-2, NAA: NOT DETECTED

## 2019-10-09 ENCOUNTER — Telehealth: Payer: Self-pay | Admitting: Family Medicine

## 2019-10-09 NOTE — Telephone Encounter (Signed)
Patient needs a work note from last Thursday through this Tuesday. He had a telephone visit with [employer] and had to go for Covid testing which ended up being negative. Per his work he still has to be out 1 week. Work note written and patient states that he will print off of Mychart

## 2020-03-04 ENCOUNTER — Encounter: Payer: Self-pay | Admitting: Family Medicine

## 2020-03-04 ENCOUNTER — Ambulatory Visit (INDEPENDENT_AMBULATORY_CARE_PROVIDER_SITE_OTHER): Payer: Managed Care, Other (non HMO) | Admitting: Family Medicine

## 2020-03-04 DIAGNOSIS — M545 Low back pain, unspecified: Secondary | ICD-10-CM

## 2020-03-04 DIAGNOSIS — S39012A Strain of muscle, fascia and tendon of lower back, initial encounter: Secondary | ICD-10-CM

## 2020-03-04 MED ORDER — CYCLOBENZAPRINE HCL 5 MG PO TABS: 5.0000 mg | ORAL_TABLET | Freq: Three times a day (TID) | ORAL | 0 refills | Status: AC | PRN

## 2020-03-04 MED ORDER — PREDNISONE 20 MG PO TABS: ORAL_TABLET | ORAL | 0 refills | Status: DC

## 2020-03-04 NOTE — Progress Notes (Signed)
Virtual Visit via telephone Note Due to COVID-19 pandemic this visit was conducted virtually. This visit type was conducted due to national recommendations for restrictions regarding the COVID-19 Pandemic (e.g. social distancing, sheltering in place) in an effort to limit this patient's exposure and mitigate transmission in our community. All issues noted in this document were discussed and addressed.  A physical exam was not performed with this format.   I connected with James Blackburn on 03/04/2020 at 1400 by telephone and verified that I am speaking with the correct person using two identifiers. James Blackburn is currently located at home and family is currently with them during visit. The provider, Monia Pouch, FNP is located in their office at time of visit.  I discussed the limitations, risks, security and privacy concerns of performing an evaluation and management service by telephone and the availability of in person appointments. I also discussed with the patient that there may be a patient responsible charge related to this service. The patient expressed understanding and agreed to proceed.  Subjective:  Patient ID: James Blackburn, male    DOB: 05/09/1976, 44 y.o.   MRN: [id number]  Chief Complaint:  Back Pain   HPI: James Blackburn is a 44 y.o. male presenting on 03/04/2020 for Back Pain   Pt reports he was doing some tree work and hurt his lower back.   Back Pain This is a new problem. The current episode started in the past 7 days. The problem is unchanged. The pain is present in the lumbar spine. The quality of the pain is described as aching, stabbing and burning. The pain does not radiate. The pain is at a severity of 5/10. The pain is moderate. The pain is worse during the day. The symptoms are aggravated by bending, position, twisting and stress. Stiffness is present in the morning. Pertinent negatives include no abdominal pain, bladder incontinence, bowel  incontinence, chest pain, dysuria, fever, headaches, leg pain, numbness, paresis, paresthesias, pelvic pain, perianal numbness, tingling, weakness or weight loss. He has tried heat for the symptoms. The treatment provided no relief.     Relevant past medical, surgical, family, and social history reviewed and updated as indicated.  Allergies and medications reviewed and updated.   Past Medical History:  Diagnosis Date  . Anal fissure 2008   per Dr Day  . H/O hiatal hernia   . Hyperlipidemia   . Hypertension   . Hypothyroidism   . Nephrolithiasis     Past Surgical History:  Procedure Laterality Date  . TONSILLECTOMY    . VASECTOMY      Social History   Socioeconomic History  . Marital status: Married    Spouse name: Not on file  . Number of children: 2  . Years of education: Not on file  . Highest education level: Not on file  Occupational History  . Occupation: Glass blower/designer: [employer]  Tobacco Use  . Smoking status: Never Smoker  . Smokeless tobacco: Never Used  Substance and Sexual Activity  . Alcohol use: No  . Drug use: No  . Sexual activity: Not on file  Other Topics Concern  . Not on file  Social History Narrative   3 caffeine drinks daily    Lives with wife and children in one story home.   High school education.   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   Social Determinants of Health   Financial Resource Strain:   . Difficulty of Paying Living  Expenses: Not on file  Food Insecurity:   . Worried About Charity fundraiser in the Last Year: Not on file  . Ran Out of Food in the Last Year: Not on file  Transportation Needs:   . Lack of Transportation (Medical): Not on file  . Lack of Transportation (Non-Medical): Not on file  Physical Activity:   . Days of Exercise per Week: Not on file  . Minutes of Exercise per Session: Not on file  Stress:   . Feeling of Stress : Not on file  Social Connections:   . Frequency of Communication with  Friends and Family: Not on file  . Frequency of Social Gatherings with Friends and Family: Not on file  . Attends Religious Services: Not on file  . Active Member of Clubs or Organizations: Not on file  . Attends Archivist Meetings: Not on file  . Marital Status: Not on file  Intimate Partner Violence:   . Fear of Current or Ex-Partner: Not on file  . Emotionally Abused: Not on file  . Physically Abused: Not on file  . Sexually Abused: Not on file    Outpatient Encounter Medications as of 03/04/2020  Medication Sig  . amoxicillin-clavulanate (AUGMENTIN) 875-125 MG tablet Take 1 tablet by mouth 2 (two) times daily. Take all of this medication  . cyclobenzaprine (FLEXERIL) 5 MG tablet Take 1 tablet (5 mg total) by mouth 3 (three) times daily as needed for up to 10 days for muscle spasms.  Marland Kitchen levothyroxine (SYNTHROID, LEVOTHROID) 75 MCG tablet Take 1 tablet (75 mcg total) by mouth daily.  Marland Kitchen lisinopril (PRINIVIL,ZESTRIL) 20 MG tablet Take 1 tablet (20 mg total) by mouth daily.  . predniSONE (DELTASONE) 20 MG tablet 2 po at sametime daily for 5 days   No facility-administered encounter medications on file as of 03/04/2020.    Allergies  Allergen Reactions  . Entex T [Pseudoephedrine-Guaifenesin] Other (See Comments)    "Makes my legs ache"  . Fenofibrate Other (See Comments)    myalgias    Review of Systems  Constitutional: Negative for activity change, appetite change, chills, diaphoresis, fatigue, fever, unexpected weight change and weight loss.  HENT: Negative.   Eyes: Negative.   Respiratory: Negative for cough, chest tightness and shortness of breath.   Cardiovascular: Negative for chest pain, palpitations and leg swelling.  Gastrointestinal: Negative for abdominal pain, blood in stool, bowel incontinence, constipation, diarrhea, nausea and vomiting.  Endocrine: Negative.   Genitourinary: Negative for bladder incontinence, decreased urine volume, difficulty urinating,  dysuria, frequency, pelvic pain and urgency.  Musculoskeletal: Positive for arthralgias, back pain and myalgias.  Skin: Negative.   Allergic/Immunologic: Negative.   Neurological: Negative for dizziness, tingling, tremors, seizures, syncope, facial asymmetry, speech difficulty, weakness, light-headedness, numbness, headaches and paresthesias.  Hematological: Negative.   Psychiatric/Behavioral: Negative for confusion, hallucinations, sleep disturbance and suicidal ideas.  All other systems reviewed and are negative.        Observations/Objective: No vital signs or physical exam, this was a telephone or virtual health encounter.  Pt alert and oriented, answers all questions appropriately, and able to speak in full sentences.    Assessment and Plan: Demetrie was seen today for back pain.  Diagnoses and all orders for this visit:  Acute midline low back pain without sciatica Strain of lumbar region, initial encounter Lower back pain with muscle strain. No red flags concerning for cauda equina syndrome. Will treat with below. Pt aware to report any new, worsening, or persistent  symptoms.  -     predniSONE (DELTASONE) 20 MG tablet; 2 po at sametime daily for 5 days -     cyclobenzaprine (FLEXERIL) 5 MG tablet; Take 1 tablet (5 mg total) by mouth 3 (three) times daily as needed for up to 10 days for muscle spasms.     Follow Up Instructions: Return in about 6 weeks (around 04/15/2020), or if symptoms worsen or fail to improve.    I discussed the assessment and treatment plan with the patient. The patient was provided an opportunity to ask questions and all were answered. The patient agreed with the plan and demonstrated an understanding of the instructions.   The patient was advised to call back or seek an in-person evaluation if the symptoms worsen or if the condition fails to improve as anticipated.  The above assessment and management plan was discussed with the patient. The patient  verbalized understanding of and has agreed to the management plan. Patient is aware to call the clinic if they develop any new symptoms or if symptoms persist or worsen. Patient is aware when to return to the clinic for a follow-up visit. Patient educated on when it is appropriate to go to the emergency department.    I provided 15 minutes of non-face-to-face time during this encounter. The call started at 1400. The call ended at 1415. The other time was used for coordination of care.    Monia Pouch, FNP-C [facility] [address] [place], [place] [address] [phone] 03/04/2020

## 2020-05-10 ENCOUNTER — Other Ambulatory Visit: Payer: Self-pay | Admitting: Nurse Practitioner

## 2020-05-10 DIAGNOSIS — I10 Essential (primary) hypertension: Secondary | ICD-10-CM

## 2020-05-10 DIAGNOSIS — E039 Hypothyroidism, unspecified: Secondary | ICD-10-CM

## 2020-05-10 MED ORDER — LEVOTHYROXINE SODIUM 75 MCG PO TABS: 75.0000 ug | ORAL_TABLET | Freq: Every day | ORAL | 0 refills | Status: DC

## 2020-05-10 MED ORDER — LISINOPRIL 20 MG PO TABS: 20.0000 mg | ORAL_TABLET | Freq: Every day | ORAL | 0 refills | Status: DC

## 2020-05-10 NOTE — Telephone Encounter (Signed)
  Prescription Request  05/10/2020  What is the name of the medication or equipment? Lisinopril 20 mg, Levothyroxine 75 MCG Has appt 5-18 with Onyeje and wants enough to cover til then  Have you contacted your pharmacy to request a refill? (if applicable) Yes  Which [facility] would you like this sent to? [facility]   Patient notified that their request is being sent to the clinical staff for review and that they should receive a response within 2 business days.

## 2020-05-14 ENCOUNTER — Encounter: Payer: Self-pay | Admitting: Nurse Practitioner

## 2020-05-14 ENCOUNTER — Ambulatory Visit: Payer: Managed Care, Other (non HMO) | Admitting: Nurse Practitioner

## 2020-05-14 ENCOUNTER — Other Ambulatory Visit: Payer: Self-pay

## 2020-05-14 VITALS — BP 132/82 | HR 83 | Temp 97.7°F | Resp 20 | Ht 73.0 in | Wt 234.0 lb

## 2020-05-14 DIAGNOSIS — I10 Essential (primary) hypertension: Secondary | ICD-10-CM | POA: Diagnosis not present

## 2020-05-14 DIAGNOSIS — E039 Hypothyroidism, unspecified: Secondary | ICD-10-CM | POA: Diagnosis not present

## 2020-05-14 NOTE — Assessment & Plan Note (Signed)
Well managed on current plan no changes to current medication, labs collected today.

## 2020-05-14 NOTE — Assessment & Plan Note (Addendum)
Well controlled on current plan, no changes to medication. Labs collected today, continue to work on low sodium diet and exercise.

## 2020-05-14 NOTE — Patient Instructions (Addendum)
Hypertension is well controlled on current plan, no changes to medication. Labs collected today, continue to work on low sodium diet and exercise.   Hypothyroidism is well managed on current plan, no changes to medication.labs collected, pending results.  Hypothyroidism  Hypothyroidism is when the thyroid gland does not make enough of certain hormones (it is underactive). The thyroid gland is a small gland located in the lower front part of the neck, just in front of the windpipe (trachea). This gland makes hormones that help control how the body uses food for energy (metabolism) as well as how the heart and brain function. These hormones also play a role in keeping your bones strong. When the thyroid is underactive, it produces too little of the hormones thyroxine (T4) and triiodothyronine (T3). What are the causes? This condition may be caused by:  Hashimoto's disease. This is a disease in which the body's disease-fighting system (immune system) attacks the thyroid gland. This is the most common cause.  Viral infections.  Pregnancy.  Certain medicines.  Birth defects.  Past radiation treatments to the head or neck for cancer.  Past treatment with radioactive iodine.  Past exposure to radiation in the environment.  Past surgical removal of part or all of the thyroid.  Problems with a gland in the center of the brain (pituitary gland).  Lack of enough iodine in the diet. What increases the risk? You are more likely to develop this condition if:  You are male.  You have a family history of thyroid conditions.  You use a medicine called lithium.  You take medicines that affect the immune system (immunosuppressants). What are the signs or symptoms? Symptoms of this condition include:  Feeling as though you have no energy (lethargy).  Not being able to tolerate cold.  Weight gain that is not explained by a change in diet or exercise habits.  Lack of appetite.  Dry  skin.  Coarse hair.  Menstrual irregularity.  Slowing of thought processes.  Constipation.  Sadness or depression. How is this diagnosed? This condition may be diagnosed based on:  Your symptoms, your medical history, and a physical exam.  Blood tests. You may also have imaging tests, such as an ultrasound or MRI. How is this treated? This condition is treated with medicine that replaces the thyroid hormones that your body does not make. After you begin treatment, it may take several weeks for symptoms to go away. Follow these instructions at home:  Take over-the-counter and prescription medicines only as told by your health care provider.  If you start taking any new medicines, tell your health care provider.  Keep all follow-up visits as told by your health care provider. This is important. ? As your condition improves, your dosage of thyroid hormone medicine may change. ? You will need to have blood tests regularly so that your health care provider can monitor your condition. Contact a health care provider if:  Your symptoms do not get better with treatment.  You are taking thyroid replacement medicine and you: ? Sweat a lot. ? Have tremors. ? Feel anxious. ? Lose weight rapidly. ? Cannot tolerate heat. ? Have emotional swings. ? Have diarrhea. ? Feel weak. Get help right away if you have:  Chest pain.  An irregular heartbeat.  A rapid heartbeat.  Difficulty breathing. Summary  Hypothyroidism is when the thyroid gland does not make enough of certain hormones (it is underactive).  When the thyroid is underactive, it produces too little of the hormones  thyroxine (T4) and triiodothyronine (T3).  The most common cause is Hashimoto's disease, a disease in which the body's disease-fighting system (immune system) attacks the thyroid gland. The condition can also be caused by viral infections, medicine, pregnancy, or past radiation treatment to the head or neck.   Symptoms may include weight gain, dry skin, constipation, feeling as though you do not have energy, and not being able to tolerate cold.  This condition is treated with medicine to replace the thyroid hormones that your body does not make. This information is not intended to replace advice given to you by your health care provider. Make sure you discuss any questions you have with your health care provider. Document Revised: 11/26/2017 Document Reviewed: 11/24/2017 Elsevier Patient Education  [employer].    Hypertension, Adult High blood pressure (hypertension) is when the force of blood pumping through the arteries is too strong. The arteries are the blood vessels that carry blood from the heart throughout the body. Hypertension forces the heart to work harder to pump blood and may cause arteries to become narrow or stiff. Untreated or uncontrolled hypertension can cause a heart attack, heart failure, a stroke, kidney disease, and other problems. A blood pressure reading consists of a higher number over a lower number. Ideally, your blood pressure should be below 120/80. The first ("top") number is called the systolic pressure. It is a measure of the pressure in your arteries as your heart beats. The second ("bottom") number is called the diastolic pressure. It is a measure of the pressure in your arteries as the heart relaxes. What are the causes? The exact cause of this condition is not known. There are some conditions that result in or are related to high blood pressure. What increases the risk? Some risk factors for high blood pressure are under your control. The following factors may make you more likely to develop this condition:  Smoking.  Having type 2 diabetes mellitus, high cholesterol, or both.  Not getting enough exercise or physical activity.  Being overweight.  Having too much fat, sugar, calories, or salt (sodium) in your diet.  Drinking too much alcohol. Some risk  factors for high blood pressure may be difficult or impossible to change. Some of these factors include:  Having chronic kidney disease.  Having a family history of high blood pressure.  Age. Risk increases with age.  Race. You may be at higher risk if you are African American.  Gender. Men are at higher risk than women before age 28. After age 80, women are at higher risk than men.  Having obstructive sleep apnea.  Stress. What are the signs or symptoms? High blood pressure may not cause symptoms. Very high blood pressure (hypertensive crisis) may cause:  Headache.  Anxiety.  Shortness of breath.  Nosebleed.  Nausea and vomiting.  Vision changes.  Severe chest pain.  Seizures. How is this diagnosed? This condition is diagnosed by measuring your blood pressure while you are seated, with your arm resting on a flat surface, your legs uncrossed, and your feet flat on the floor. The cuff of the blood pressure monitor will be placed directly against the skin of your upper arm at the level of your heart. It should be measured at least twice using the same arm. Certain conditions can cause a difference in blood pressure between your right and left arms. Certain factors can cause blood pressure readings to be lower or higher than normal for a short period of time:  When your blood pressure is higher when you are in a health care provider's office than when you are at home, this is called white coat hypertension. Most people with this condition do not need medicines.  When your blood pressure is higher at home than when you are in a health care provider's office, this is called masked hypertension. Most people with this condition may need medicines to control blood pressure. If you have a high blood pressure reading during one visit or you have normal blood pressure with other risk factors, you may be asked to:  Return on a different day to have your blood pressure checked again.   Monitor your blood pressure at home for 1 week or longer. If you are diagnosed with hypertension, you may have other blood or imaging tests to help your health care provider understand your overall risk for other conditions. How is this treated? This condition is treated by making healthy lifestyle changes, such as eating healthy foods, exercising more, and reducing your alcohol intake. Your health care provider may prescribe medicine if lifestyle changes are not enough to get your blood pressure under control, and if:  Your systolic blood pressure is above 130.  Your diastolic blood pressure is above 80. Your personal target blood pressure may vary depending on your medical conditions, your age, and other factors. Follow these instructions at home: Eating and drinking   Eat a diet that is high in fiber and potassium, and low in sodium, added sugar, and fat. An example eating plan is called the DASH (Dietary Approaches to Stop Hypertension) diet. To eat this way: ? Eat plenty of fresh fruits and vegetables. Try to fill one half of your plate at each meal with fruits and vegetables. ? Eat whole grains, such as whole-wheat pasta, brown rice, or whole-grain bread. Fill about one fourth of your plate with whole grains. ? Eat or drink low-fat dairy products, such as skim milk or low-fat yogurt. ? Avoid fatty cuts of meat, processed or cured meats, and poultry with skin. Fill about one fourth of your plate with lean proteins, such as fish, chicken without skin, beans, eggs, or tofu. ? Avoid pre-made and processed foods. These tend to be higher in sodium, added sugar, and fat.  Reduce your daily sodium intake. Most people with hypertension should eat less than 1,500 mg of sodium a day.  Do not drink alcohol if: ? Your health care provider tells you not to drink. ? You are pregnant, may be pregnant, or are planning to become pregnant.  If you drink alcohol: ? Limit how much you use to:  0-1  drink a day for women.  0-2 drinks a day for men. ? Be aware of how much alcohol is in your drink. In the [place] one drink equals one 12 oz bottle of beer (355 mL), one 5 oz glass of wine (148 mL), or one 1 oz glass of hard liquor (44 mL). Lifestyle   Work with your health care provider to maintain a healthy body weight or to lose weight. Ask what an ideal weight is for you.  Get at least 30 minutes of exercise most days of the week. Activities may include walking, swimming, or biking.  Include exercise to strengthen your muscles (resistance exercise), such as Pilates or lifting weights, as part of your weekly exercise routine. Try to do these types of exercises for 30 minutes at least 3 days a week.  Do not use any products that contain  nicotine or tobacco, such as cigarettes, e-cigarettes, and chewing tobacco. If you need help quitting, ask your health care provider.  Monitor your blood pressure at home as told by your health care provider.  Keep all follow-up visits as told by your health care provider. This is important. Medicines  Take over-the-counter and prescription medicines only as told by your health care provider. Follow directions carefully. Blood pressure medicines must be taken as prescribed.  Do not skip doses of blood pressure medicine. Doing this puts you at risk for problems and can make the medicine less effective.  Ask your health care provider about side effects or reactions to medicines that you should watch for. Contact a health care provider if you:  Think you are having a reaction to a medicine you are taking.  Have headaches that keep coming back (recurring).  Feel dizzy.  Have swelling in your ankles.  Have trouble with your vision. Get help right away if you:  Develop a severe headache or confusion.  Have unusual weakness or numbness.  Feel faint.  Have severe pain in your chest or abdomen.  Vomit repeatedly.  Have trouble breathing.  Summary  Hypertension is when the force of blood pumping through your arteries is too strong. If this condition is not controlled, it may put you at risk for serious complications.  Your personal target blood pressure may vary depending on your medical conditions, your age, and other factors. For most people, a normal blood pressure is less than 120/80.  Hypertension is treated with lifestyle changes, medicines, or a combination of both. Lifestyle changes include losing weight, eating a healthy, low-sodium diet, exercising more, and limiting alcohol. This information is not intended to replace advice given to you by your health care provider. Make sure you discuss any questions you have with your health care provider. Document Revised: 08/24/2018 Document Reviewed: 08/24/2018 Elsevier Patient Education  2020 [place] Patient Education  [employer].

## 2020-05-14 NOTE — Progress Notes (Addendum)
Established Patient Office Visit  Subjective:  Patient ID: James Blackburn, male    DOB: [birth year]  Age: 44 y.o. MRN: [id number]  CC:  Chief Complaint  Patient presents with  . Medical Management of Chronic Issues  . Hypothyroidism  . Hypertension    HPI James Blackburn presents for follow up of hypertension. Patient was diagnosed in 2012. The patient is tolerating current lisinopril 20 mg tablet daily without side effects. Compliance with treatment has been good; including taking medication as directed , maintains a healthy diet and regular exercise regimen, and following up as directed. Patient is not reporting any signs and symptoms of hypertension.   Concerning patients hypothyroid, he was first diagnosed in 2016, the patient is tolerating synthroid 75 MCG tablet daily without side effects. Compliance with treatment has been good; including taking medication as prescribed, maintains a healthy diet and regular exercise regimen, and following up as directed. Patient is not reporting any signs and symptoms of hypothyroidism.    Past Medical History:  Diagnosis Date  . Anal fissure 2008   per Dr Day  . H/O hiatal hernia   . Hyperlipidemia   . Hypertension   . Hypothyroidism   . Nephrolithiasis     Past Surgical History:  Procedure Laterality Date  . TONSILLECTOMY    . VASECTOMY      Family History  Problem Relation Age of Onset  . COPD Mother        Deceased, 20  . Pneumonia Father        Deceased, late 77s  . Healthy Sister        x 2  . Healthy Son   . Healthy Daughter   . Hypertension Paternal Grandfather   . Colon cancer Neg Hx   . Esophageal cancer Neg Hx   . Rectal cancer Neg Hx   . Stomach cancer Neg Hx     Social History   Socioeconomic History  . Marital status: Married    Spouse name: Not on file  . Number of children: 2  . Years of education: Not on file  . Highest education level: Not on file  Occupational History  . Occupation:  Glass blower/designer: [employer]  Tobacco Use  . Smoking status: Never Smoker  . Smokeless tobacco: Never Used  Substance and Sexual Activity  . Alcohol use: No  . Drug use: No  . Sexual activity: Not on file  Other Topics Concern  . Not on file  Social History Narrative   3 caffeine drinks daily    Lives with wife and children in one story home.   High school education.   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   Social Determinants of Health   Financial Resource Strain:   . Difficulty of Paying Living Expenses:   Food Insecurity:   . Worried About Charity fundraiser in the Last Year:   . Arboriculturist in the Last Year:   Transportation Needs:   . Film/video editor (Medical):   Marland Kitchen Lack of Transportation (Non-Medical):   Physical Activity:   . Days of Exercise per Week:   . Minutes of Exercise per Session:   Stress:   . Feeling of Stress :   Social Connections:   . Frequency of Communication with Friends and Family:   . Frequency of Social Gatherings with Friends and Family:   . Attends Religious Services:   . Active Member of Clubs or Organizations:   .  Attends Archivist Meetings:   Marland Kitchen Marital Status:   Intimate Partner Violence:   . Fear of Current or Ex-Partner:   . Emotionally Abused:   Marland Kitchen Physically Abused:   . Sexually Abused:     Outpatient Medications Prior to Visit  Medication Sig Dispense Refill  . levothyroxine (SYNTHROID) 75 MCG tablet Take 1 tablet (75 mcg total) by mouth daily. 30 tablet 0  . lisinopril (ZESTRIL) 20 MG tablet Take 1 tablet (20 mg total) by mouth daily. 30 tablet 0  . amoxicillin-clavulanate (AUGMENTIN) 875-125 MG tablet Take 1 tablet by mouth 2 (two) times daily. Take all of this medication 20 tablet 0  . predniSONE (DELTASONE) 20 MG tablet 2 po at sametime daily for 5 days 10 tablet 0   No facility-administered medications prior to visit.    Allergies  Allergen Reactions  . Entex T [Pseudoephedrine-Guaifenesin]  Other (See Comments)    "Makes my legs ache"  . Fenofibrate Other (See Comments)    myalgias    ROS Review of Systems  Constitutional: Negative.   HENT: Negative.   Eyes: Negative.   Respiratory: Negative.   Gastrointestinal: Negative.   Endocrine: Negative for cold intolerance, heat intolerance, polydipsia, polyphagia and polyuria.  Genitourinary: Negative for difficulty urinating.  Musculoskeletal: Negative for arthralgias, joint swelling and myalgias.  Skin: Negative for color change and rash.  Neurological: Negative for dizziness, weakness, numbness and headaches.  Psychiatric/Behavioral: Negative for agitation. The patient is not nervous/anxious.       Objective:    Physical Exam  Constitutional: He is oriented to person, place, and time. He appears well-developed.  HENT:  Head: Normocephalic.  Eyes: Conjunctivae are normal.  Cardiovascular: Normal rate and regular rhythm.  Pulmonary/Chest: Breath sounds normal.  Abdominal: Bowel sounds are normal.  Musculoskeletal:        General: No tenderness.     Cervical back: Neck supple.  Neurological: He is alert and oriented to person, place, and time.  Skin: Skin is warm. No rash noted. No erythema.  Psychiatric: He has a normal mood and affect. His behavior is normal.    BP 132/82   Pulse 83   Temp 97.7 F (36.5 C)   Resp 20   Ht '6\' 1"'$  (1.854 m)   Wt 234 lb (106.1 kg)   SpO2 95%   BMI 30.87 kg/m  Wt Readings from Last 3 Encounters:  05/14/20 234 lb (106.1 kg)  03/21/19 232 lb 9.6 oz (105.5 kg)  02/17/19 234 lb 3.2 oz (106.2 kg)       Lab Results  Component Value Date   TSH 2.040 03/21/2019   Lab Results  Component Value Date   WBC 6.9 03/21/2019   HGB 16.6 03/21/2019   HCT 49.2 03/21/2019   MCV 88 03/21/2019   PLT 326 03/21/2019   Lab Results  Component Value Date   NA 139 03/21/2019   K 4.2 03/21/2019   CO2 21 03/21/2019   GLUCOSE 94 03/21/2019   BUN 11 03/21/2019   CREATININE 0.85  03/21/2019   BILITOT 0.4 03/21/2019   ALKPHOS 84 03/21/2019   AST 44 (H) 03/21/2019   ALT 69 (H) 03/21/2019   PROT 7.3 03/21/2019   ALBUMIN 4.7 03/21/2019   CALCIUM 9.4 03/21/2019   Lab Results  Component Value Date   CHOL 194 03/21/2019   Lab Results  Component Value Date   HDL 25 (L) 03/21/2019   Lab Results  Component Value Date   LDLCALC 99 03/21/2019  Lab Results  Component Value Date   TRIG 349 (H) 03/21/2019   Lab Results  Component Value Date   CHOLHDL 7.8 (H) 03/21/2019   No results found for: HGBA1C    Assessment & Plan:   Problem List Items Addressed This Visit      Cardiovascular and Mediastinum   HTN (hypertension)    Well controlled on current plan, no changes to medication. Labs collected today, continue to work on low sodium diet and exercise.       Relevant Orders   CMP14+EGFR     Endocrine   Hypothyroidism - Primary    Well managed on current plan no changes to current medication, labs collected today.      Relevant Orders   Thyroid Panel With TSH       Follow-up: Return in about 3 months (around 08/14/2020).    Ivy Lynn, NP

## 2020-05-15 LAB — CMP14+EGFR
ALT: 53 IU/L — ABNORMAL HIGH (ref 0–44)
AST: 47 IU/L — ABNORMAL HIGH (ref 0–40)
Albumin/Globulin Ratio: 1.5 (ref 1.2–2.2)
Albumin: 4.3 g/dL (ref 4.0–5.0)
Alkaline Phosphatase: 73 IU/L (ref 48–121)
BUN/Creatinine Ratio: 13 (ref 9–20)
BUN: 12 mg/dL (ref 6–24)
Bilirubin Total: 0.4 mg/dL (ref 0.0–1.2)
CO2: 26 mmol/L (ref 20–29)
Calcium: 9.1 mg/dL (ref 8.7–10.2)
Chloride: 104 mmol/L (ref 96–106)
Creatinine, Ser: 0.92 mg/dL (ref 0.76–1.27)
GFR calc Af Amer: 117 mL/min/{1.73_m2} (ref >59)
GFR calc non Af Amer: 102 mL/min/{1.73_m2} (ref >59)
Globulin, Total: 2.8 g/dL (ref 1.5–4.5)
Glucose: 81 mg/dL (ref 65–99)
Potassium: 4.1 mmol/L (ref 3.5–5.2)
Sodium: 140 mmol/L (ref 134–144)
Total Protein: 7.1 g/dL (ref 6.0–8.5)

## 2020-05-15 LAB — THYROID PANEL WITH TSH
Free Thyroxine Index: 1.4 (ref 1.2–4.9)
T3 Uptake Ratio: 21 % — ABNORMAL LOW (ref 24–39)
T4, Total: 6.9 ug/dL (ref 4.5–12.0)
TSH: 3.28 u[IU]/mL (ref 0.450–4.500)

## 2020-05-16 ENCOUNTER — Other Ambulatory Visit: Payer: Self-pay | Admitting: Nurse Practitioner

## 2020-05-16 ENCOUNTER — Encounter: Payer: Self-pay | Admitting: Nurse Practitioner

## 2020-05-16 ENCOUNTER — Other Ambulatory Visit: Payer: Self-pay | Admitting: *Deleted

## 2020-05-16 DIAGNOSIS — E7800 Pure hypercholesterolemia, unspecified: Secondary | ICD-10-CM

## 2020-05-16 DIAGNOSIS — E039 Hypothyroidism, unspecified: Secondary | ICD-10-CM

## 2020-05-16 DIAGNOSIS — I10 Essential (primary) hypertension: Secondary | ICD-10-CM

## 2020-05-16 MED ORDER — ICOSAPENT ETHYL 1 G PO CAPS
2.0000 g | ORAL_CAPSULE | Freq: Two times a day (BID) | ORAL | 0 refills | Status: DC
Start: 2020-05-16 — End: 2021-05-21

## 2020-05-17 ENCOUNTER — Telehealth: Payer: Self-pay | Admitting: *Deleted

## 2020-05-17 NOTE — Telephone Encounter (Signed)
PA came in for patient on VASCEPA 1GM caps  KEY: [id number]  Spoke with pt - he has declined this medication and PA will be archived.   Will notify pharm as well

## 2020-06-18 ENCOUNTER — Telehealth: Payer: Self-pay | Admitting: Family Medicine

## 2020-06-18 ENCOUNTER — Other Ambulatory Visit: Payer: Self-pay | Admitting: Family Medicine

## 2020-06-18 DIAGNOSIS — E039 Hypothyroidism, unspecified: Secondary | ICD-10-CM

## 2020-06-18 DIAGNOSIS — I10 Essential (primary) hypertension: Secondary | ICD-10-CM

## 2020-06-18 MED ORDER — LEVOTHYROXINE SODIUM 75 MCG PO TABS: 75.0000 ug | ORAL_TABLET | Freq: Every day | ORAL | 0 refills | Status: DC

## 2020-06-18 MED ORDER — LISINOPRIL 20 MG PO TABS: 20.0000 mg | ORAL_TABLET | Freq: Every day | ORAL | 0 refills | Status: DC

## 2020-06-18 NOTE — Telephone Encounter (Signed)
Spouse calling asking if patient can get a 90 day supply of lisinopril (ZESTRIL) 20 MG tablet and levothyroxine (SYNTHROID) 75 MCG tablet. Spouse says that pt normally get a 90 days supply.

## 2020-06-18 NOTE — Telephone Encounter (Signed)
90day supply sent.

## 2020-06-18 NOTE — Telephone Encounter (Signed)
  Prescription Request  06/18/2020  What is the name of the medication or equipment? levothyroxine (SYNTHROID) 75 MCG tablet lisinopril (ZESTRIL) 20 MG tablet  Have you contacted your pharmacy to request a refill? (if applicable) yes but no refills. Pt was seen in May for his routine check up for refills.  Which pharmacy would you like this sent to? [facility]   Patient notified that their request is being sent to the clinical staff for review and that they should receive a response within 2 business days.

## 2020-06-18 NOTE — Telephone Encounter (Signed)
Rx's sent to pharmacy.  

## 2020-06-18 NOTE — Addendum Note (Signed)
Addended by: Wardell Heath on: 06/18/2020 03:18 PM   Modules accepted: Orders

## 2020-07-16 ENCOUNTER — Telehealth: Payer: Self-pay | Admitting: Family Medicine

## 2020-07-16 DIAGNOSIS — E039 Hypothyroidism, unspecified: Secondary | ICD-10-CM

## 2020-07-16 DIAGNOSIS — I10 Essential (primary) hypertension: Secondary | ICD-10-CM

## 2020-07-16 MED ORDER — LISINOPRIL 20 MG PO TABS: 20.0000 mg | ORAL_TABLET | Freq: Every day | ORAL | 0 refills | Status: DC

## 2020-07-16 MED ORDER — LEVOTHYROXINE SODIUM 75 MCG PO TABS: 75.0000 ug | ORAL_TABLET | Freq: Every day | ORAL | 0 refills | Status: DC

## 2020-07-16 NOTE — Telephone Encounter (Signed)
Refills sent to [facility]

## 2020-08-20 ENCOUNTER — Ambulatory Visit: Payer: Managed Care, Other (non HMO) | Admitting: Family Medicine

## 2020-08-20 ENCOUNTER — Other Ambulatory Visit: Payer: Self-pay

## 2020-08-20 ENCOUNTER — Encounter: Payer: Self-pay | Admitting: Family Medicine

## 2020-08-20 VITALS — BP 129/86 | HR 68 | Temp 97.2°F | Ht 73.0 in | Wt 238.4 lb

## 2020-08-20 DIAGNOSIS — E039 Hypothyroidism, unspecified: Secondary | ICD-10-CM

## 2020-08-20 DIAGNOSIS — I10 Essential (primary) hypertension: Secondary | ICD-10-CM | POA: Diagnosis not present

## 2020-08-20 DIAGNOSIS — E7800 Pure hypercholesterolemia, unspecified: Secondary | ICD-10-CM

## 2020-08-20 NOTE — Progress Notes (Signed)
Subjective:  Patient ID: James Blackburn, male    DOB: [birth year]  Age: 44 y.o. MRN: [id number]  CC: Follow-up (3 month)   HPI James Blackburn presents for  follow-up of hypertension. Patient has no history of headache chest pain or shortness of breath or recent cough. Patient also denies symptoms of TIA such as focal numbness or weakness. Patient denies side effects from medication. States taking it regularly.  Patient presents for follow-up on  thyroid. The patient has a history of hypothyroidism for many years. It has been stable recently. Pt. denies any change in  voice, loss of hair, heat or cold intolerance. Energy level has been adequate to good. Patient denies constipation and diarrhea. No myxedema. Medication is as noted below. Verified that pt is taking it daily on an empty stomach. Well tolerated.  Patient in for follow-up of elevated cholesterol.  He is currently not taking the Vascepa that was ordered in the past.  History James Blackburn has a past medical history of Anal fissure (2008), H/O hiatal hernia, Hyperlipidemia, Hypertension, Hypothyroidism, and Nephrolithiasis.   He has a past surgical history that includes Vasectomy and Tonsillectomy.   His family history includes COPD in his mother; Healthy in his daughter, sister, and son; Hypertension in his paternal grandfather; Pneumonia in his father.He reports that he has never smoked. He has never used smokeless tobacco. He reports that he does not drink alcohol and does not use drugs.  Current Outpatient Medications on File Prior to Visit  Medication Sig Dispense Refill  . levothyroxine (SYNTHROID) 75 MCG tablet Take 1 tablet (75 mcg total) by mouth daily. 90 tablet 0  . lisinopril (ZESTRIL) 20 MG tablet Take 1 tablet (20 mg total) by mouth daily. 90 tablet 0  . icosapent Ethyl (VASCEPA) 1 g capsule Take 2 capsules (2 g total) by mouth 2 (two) times daily. (Patient not taking: Reported on 08/20/2020) 120 capsule 0   No  current facility-administered medications on file prior to visit.    ROS Review of Systems  Constitutional: Negative.   HENT: Negative.   Eyes: Negative for visual disturbance.  Respiratory: Negative for cough and shortness of breath.   Cardiovascular: Negative for chest pain and leg swelling.  Gastrointestinal: Negative for abdominal pain, diarrhea, nausea and vomiting.  Genitourinary: Negative for difficulty urinating.  Musculoskeletal: Negative for arthralgias and myalgias.  Skin: Negative for rash.  Neurological: Negative for headaches.  Psychiatric/Behavioral: Negative for sleep disturbance.    Objective:  BP 129/86   Pulse 68   Temp (!) 97.2 F (36.2 C) (Temporal)   Ht 6' 1" (1.854 m)   Wt 238 lb 6.4 oz (108.1 kg)   BMI 31.45 kg/m   BP Readings from Last 3 Encounters:  08/20/20 129/86  05/14/20 132/82  03/21/19 136/89    Wt Readings from Last 3 Encounters:  08/20/20 238 lb 6.4 oz (108.1 kg)  05/14/20 234 lb (106.1 kg)  03/21/19 232 lb 9.6 oz (105.5 kg)     Physical Exam Vitals reviewed.  Constitutional:      Appearance: He is well-developed.  HENT:     Head: Normocephalic and atraumatic.     Right Ear: Tympanic membrane and external ear normal. No decreased hearing noted.     Left Ear: Tympanic membrane and external ear normal. No decreased hearing noted.     Mouth/Throat:     Pharynx: No oropharyngeal exudate or posterior oropharyngeal erythema.  Eyes:     Pupils: Pupils are equal, round, and  reactive to light.  Cardiovascular:     Rate and Rhythm: Normal rate and regular rhythm.     Heart sounds: No murmur heard.   Pulmonary:     Effort: No respiratory distress.     Breath sounds: Normal breath sounds.  Abdominal:     General: Bowel sounds are normal.     Palpations: Abdomen is soft. There is no mass.     Tenderness: There is no abdominal tenderness.  Musculoskeletal:     Cervical back: Normal range of motion and neck supple.     Results  for orders placed or performed in visit on 08/20/20  CBC with Differential/Platelet  Result Value Ref Range   WBC 6.1 3.4 - 10.8 x10E3/uL   RBC 6.02 (H) 4.14 - 5.80 x10E6/uL   Hemoglobin 17.3 13.0 - 17.7 g/dL   Hematocrit 53.1 (H) 37.5 - 51.0 %   MCV 88 79 - 97 fL   MCH 28.7 26.6 - 33.0 pg   MCHC 32.6 31 - 35 g/dL   RDW 13.3 11.6 - 15.4 %   Platelets 300 150 - 450 x10E3/uL   Neutrophils 37 Not Estab. %   Lymphs 47 Not Estab. %   Monocytes 12 Not Estab. %   Eos 3 Not Estab. %   Basos 1 Not Estab. %   Neutrophils Absolute 2.3 1 - 7 x10E3/uL   Lymphocytes Absolute 2.9 0 - 3 x10E3/uL   Monocytes Absolute 0.7 0 - 0 x10E3/uL   EOS (ABSOLUTE) 0.2 0.0 - 0.4 x10E3/uL   Basophils Absolute 0.0 0 - 0 x10E3/uL   Immature Granulocytes 0 Not Estab. %   Immature Grans (Abs) 0.0 0.0 - 0.1 x10E3/uL  CMP14+EGFR  Result Value Ref Range   Glucose 96 65 - 99 mg/dL   BUN 7 6 - 24 mg/dL   Creatinine, Ser 0.74 (L) 0.76 - 1.27 mg/dL   GFR calc non Af Amer 113 >59 mL/min/1.73   GFR calc Af Amer 131 >59 mL/min/1.73   BUN/Creatinine Ratio 9 9 - 20   Sodium 140 134 - 144 mmol/L   Potassium 4.7 3.5 - 5.2 mmol/L   Chloride 102 96 - 106 mmol/L   CO2 25 20 - 29 mmol/L   Calcium 9.6 8.7 - 10.2 mg/dL   Total Protein 7.3 6.0 - 8.5 g/dL   Albumin 4.7 4.0 - 5.0 g/dL   Globulin, Total 2.6 1.5 - 4.5 g/dL   Albumin/Globulin Ratio 1.8 1.2 - 2.2   Bilirubin Total 0.5 0.0 - 1.2 mg/dL   Alkaline Phosphatase 83 48 - 121 IU/L   AST 56 (H) 0 - 40 IU/L   ALT 72 (H) 0 - 44 IU/L  Lipid panel  Result Value Ref Range   Cholesterol, Total 191 100 - 199 mg/dL   Triglycerides 249 (H) 0 - 149 mg/dL   HDL 26 (L) >39 mg/dL   VLDL Cholesterol Cal 44 (H) 5 - 40 mg/dL   LDL Chol Calc (NIH) 121 (H) 0 - 99 mg/dL   Chol/HDL Ratio 7.3 (H) 0.0 - 5.0 ratio   v   Assessment & Plan:   James Blackburn was seen today for follow-up.  Diagnoses and all orders for this visit:  Essential hypertension -     CBC with  Differential/Platelet -     CMP14+EGFR  Pure hypercholesterolemia -     CBC with Differential/Platelet -     CMP14+EGFR -     Lipid panel  Hypothyroidism, unspecified type -  CBC with Differential/Platelet -     CMP14+EGFR   Allergies as of 08/20/2020      Reactions   Entex T [pseudoephedrine-guaifenesin] Other (See Comments)   "Makes my legs ache"   Fenofibrate Other (See Comments)   myalgias      Medication List       Accurate as of August 20, 2020 11:59 PM. If you have any questions, ask your nurse or doctor.        icosapent Ethyl 1 g capsule Commonly known as: Vascepa Take 2 capsules (2 g total) by mouth 2 (two) times daily.   levothyroxine 75 MCG tablet Commonly known as: SYNTHROID Take 1 tablet (75 mcg total) by mouth daily.   lisinopril 20 MG tablet Commonly known as: ZESTRIL Take 1 tablet (20 mg total) by mouth daily.        Follow-up: Return in about 1 year (around 08/20/2021).  Claretta Fraise, M.D.

## 2020-08-21 LAB — CMP14+EGFR
ALT: 72 IU/L — ABNORMAL HIGH (ref 0–44)
AST: 56 IU/L — ABNORMAL HIGH (ref 0–40)
Albumin/Globulin Ratio: 1.8 (ref 1.2–2.2)
Albumin: 4.7 g/dL (ref 4.0–5.0)
Alkaline Phosphatase: 83 IU/L (ref 48–121)
BUN/Creatinine Ratio: 9 (ref 9–20)
BUN: 7 mg/dL (ref 6–24)
Bilirubin Total: 0.5 mg/dL (ref 0.0–1.2)
CO2: 25 mmol/L (ref 20–29)
Calcium: 9.6 mg/dL (ref 8.7–10.2)
Chloride: 102 mmol/L (ref 96–106)
Creatinine, Ser: 0.74 mg/dL — ABNORMAL LOW (ref 0.76–1.27)
GFR calc Af Amer: 131 mL/min/{1.73_m2} (ref >59)
GFR calc non Af Amer: 113 mL/min/{1.73_m2} (ref >59)
Globulin, Total: 2.6 g/dL (ref 1.5–4.5)
Glucose: 96 mg/dL (ref 65–99)
Potassium: 4.7 mmol/L (ref 3.5–5.2)
Sodium: 140 mmol/L (ref 134–144)
Total Protein: 7.3 g/dL (ref 6.0–8.5)

## 2020-08-21 LAB — LIPID PANEL
Chol/HDL Ratio: 7.3 ratio — ABNORMAL HIGH (ref 0.0–5.0)
Cholesterol, Total: 191 mg/dL (ref 100–199)
HDL: 26 mg/dL — ABNORMAL LOW (ref >39)
LDL Chol Calc (NIH): 121 mg/dL — ABNORMAL HIGH (ref 0–99)
Triglycerides: 249 mg/dL — ABNORMAL HIGH (ref 0–149)
VLDL Cholesterol Cal: 44 mg/dL — ABNORMAL HIGH (ref 5–40)

## 2020-08-21 LAB — CBC WITH DIFFERENTIAL/PLATELET
Basophils Absolute: 0 10*3/uL (ref 0.0–0.2)
Basos: 1 %
EOS (ABSOLUTE): 0.2 10*3/uL (ref 0.0–0.4)
Eos: 3 %
Hematocrit: 53.1 % — ABNORMAL HIGH (ref 37.5–51.0)
Hemoglobin: 17.3 g/dL (ref 13.0–17.7)
Immature Grans (Abs): 0 10*3/uL (ref 0.0–0.1)
Immature Granulocytes: 0 %
Lymphocytes Absolute: 2.9 10*3/uL (ref 0.7–3.1)
Lymphs: 47 %
MCH: 28.7 pg (ref 26.6–33.0)
MCHC: 32.6 g/dL (ref 31.5–35.7)
MCV: 88 fL (ref 79–97)
Monocytes Absolute: 0.7 10*3/uL (ref 0.1–0.9)
Monocytes: 12 %
Neutrophils Absolute: 2.3 10*3/uL (ref 1.4–7.0)
Neutrophils: 37 %
Platelets: 300 10*3/uL (ref 150–450)
RBC: 6.02 x10E6/uL — ABNORMAL HIGH (ref 4.14–5.80)
RDW: 13.3 % (ref 11.6–15.4)
WBC: 6.1 10*3/uL (ref 3.4–10.8)

## 2020-11-26 ENCOUNTER — Ambulatory Visit: Payer: Managed Care, Other (non HMO) | Admitting: Nurse Practitioner

## 2020-11-26 ENCOUNTER — Other Ambulatory Visit: Payer: Self-pay

## 2020-11-26 ENCOUNTER — Encounter: Payer: Self-pay | Admitting: Nurse Practitioner

## 2020-11-26 VITALS — BP 127/83 | HR 89 | Temp 97.6°F | Ht 73.0 in | Wt 235.2 lb

## 2020-11-26 DIAGNOSIS — H7092 Unspecified mastoiditis, left ear: Secondary | ICD-10-CM | POA: Insufficient documentation

## 2020-11-26 MED ORDER — PREDNISONE 20 MG PO TABS
ORAL_TABLET | ORAL | 0 refills | Status: DC
Start: 2020-11-26 — End: 2021-05-21

## 2020-11-26 NOTE — Progress Notes (Signed)
   Subjective:    Patient ID: James Blackburn, male    DOB: 1976/11/27, 44 y.o.   MRN: [id number]   Chief Complaint: Ear Pain   HPI Pt is here today with c/o ear pain on the left side for the last week and a half. The ear pain shoots down the left side of his neck and is also causing headaches. He did have some blood in the ear on Sunday. Denies injury to the area, dizziness. Pain came on gradually. Initially thought it was neck pain but the pain is radiating from one area at his ear. Pain comes and goes and is described as sharp and rates at 6/10. No none aggravating factors. Ibuprofen sometimes helps relieve pain. Also sometimes causes left eye pain.     Review of Systems  Constitutional: Negative.   HENT: Positive for ear pain (left).   Eyes: Positive for pain (left eye; with ear pain).  Respiratory: Negative.   Cardiovascular: Negative.   Gastrointestinal: Negative.   Genitourinary: Negative.   Musculoskeletal: Positive for neck pain (left side).  Skin: Negative.   Neurological: Negative.   Psychiatric/Behavioral: Negative.        Objective:   Physical Exam Vitals and nursing note reviewed.  Constitutional:      Appearance: Normal appearance.  HENT:     Head: Normocephalic.     Right Ear: Tympanic membrane normal.     Left Ear: Tympanic membrane normal. There is mastoid tenderness.     Ears:     Comments: Bloody wax in ear canal Cardiovascular:     Rate and Rhythm: Normal rate and regular rhythm.     Heart sounds: Normal heart sounds.  Pulmonary:     Effort: Pulmonary effort is normal.     Breath sounds: Normal breath sounds.  Abdominal:     General: Bowel sounds are normal.     Palpations: Abdomen is soft.  Musculoskeletal:        General: Normal range of motion.  Skin:    General: Skin is warm and dry.  Neurological:     General: No focal deficit present.     Mental Status: He is alert and oriented to person, place, and time.  Psychiatric:        Mood  and Affect: Mood normal.        Behavior: Behavior normal.   BP 127/83   Pulse 89   Temp 97.6 F (36.4 C)   Ht 6\' 1"  (1.854 m)   Wt 235 lb 3.2 oz (106.7 kg)   SpO2 96%   BMI 31.03 kg/m       Assessment & Plan:  James Blackburn in today with chief complaint of Ear Pain   1. Mastoiditis of left side Motrin or tylenol as needed for pain Moist heat to area Meds ordered this encounter  Medications  . predniSONE (DELTASONE) 20 MG tablet    Sig: Take 2 PO at the same time daily    Dispense:  10 tablet    Refill:  0       The above assessment and management plan was discussed with the patient. The patient verbalized understanding of and has agreed to the management plan. Patient is aware to call the clinic if symptoms persist or worsen. Patient is aware when to return to the clinic for a follow-up visit. Patient educated on when it is appropriate to go to the emergency department.   Mary-Margaret Hassell Done, FNP

## 2020-11-26 NOTE — Patient Instructions (Signed)

## 2021-01-18 ENCOUNTER — Other Ambulatory Visit: Payer: Self-pay | Admitting: Family Medicine

## 2021-01-18 DIAGNOSIS — I10 Essential (primary) hypertension: Secondary | ICD-10-CM

## 2021-01-21 ENCOUNTER — Other Ambulatory Visit: Payer: Self-pay | Admitting: Family Medicine

## 2021-01-21 DIAGNOSIS — E039 Hypothyroidism, unspecified: Secondary | ICD-10-CM

## 2021-02-24 ENCOUNTER — Other Ambulatory Visit: Payer: Self-pay | Admitting: Family Medicine

## 2021-02-24 DIAGNOSIS — I10 Essential (primary) hypertension: Secondary | ICD-10-CM

## 2021-02-24 NOTE — Telephone Encounter (Signed)
OV 08/20/20 RTC 1 yr

## 2021-05-08 ENCOUNTER — Telehealth: Payer: Self-pay

## 2021-05-08 ENCOUNTER — Other Ambulatory Visit: Payer: Self-pay | Admitting: Family Medicine

## 2021-05-08 ENCOUNTER — Telehealth: Payer: Self-pay | Admitting: Family Medicine

## 2021-05-08 DIAGNOSIS — I10 Essential (primary) hypertension: Secondary | ICD-10-CM

## 2021-05-08 DIAGNOSIS — E7800 Pure hypercholesterolemia, unspecified: Secondary | ICD-10-CM

## 2021-05-08 DIAGNOSIS — Z125 Encounter for screening for malignant neoplasm of prostate: Secondary | ICD-10-CM

## 2021-05-08 DIAGNOSIS — E039 Hypothyroidism, unspecified: Secondary | ICD-10-CM

## 2021-05-08 DIAGNOSIS — E559 Vitamin D deficiency, unspecified: Secondary | ICD-10-CM

## 2021-05-08 MED ORDER — LEVOTHYROXINE SODIUM 75 MCG PO TABS: 75.0000 ug | ORAL_TABLET | Freq: Every day | ORAL | 0 refills | Status: DC

## 2021-05-08 NOTE — Telephone Encounter (Signed)
Patient aware.

## 2021-05-08 NOTE — Telephone Encounter (Signed)
Patient's appt was changed to 5-25 and needs enough of levothyroxine to get him through

## 2021-05-08 NOTE — Telephone Encounter (Signed)
Aware refill sent to pharmacy ?

## 2021-05-08 NOTE — Telephone Encounter (Signed)
  Prescription Request  05/08/2021  What is the name of the medication or equipment? levothyroxine  Have you contacted your pharmacy to request a refill? (if applicable) yes  Which pharmacy would you like this sent to? [facility]   Patient notified that their request is being sent to the clinical staff for review and that they should receive a response within 2 business days.

## 2021-05-08 NOTE — Telephone Encounter (Signed)
The orders are in. Mention to him that if he comes in a day or two befor the exam, I can go over the results with him while he is here.

## 2021-05-13 ENCOUNTER — Encounter: Payer: Managed Care, Other (non HMO) | Admitting: Family Medicine

## 2021-05-21 ENCOUNTER — Other Ambulatory Visit: Payer: Self-pay

## 2021-05-21 ENCOUNTER — Encounter: Payer: Self-pay | Admitting: Family Medicine

## 2021-05-21 ENCOUNTER — Ambulatory Visit (INDEPENDENT_AMBULATORY_CARE_PROVIDER_SITE_OTHER): Payer: Managed Care, Other (non HMO) | Admitting: Family Medicine

## 2021-05-21 DIAGNOSIS — E7800 Pure hypercholesterolemia, unspecified: Secondary | ICD-10-CM

## 2021-05-21 DIAGNOSIS — Z0000 Encounter for general adult medical examination without abnormal findings: Secondary | ICD-10-CM | POA: Diagnosis not present

## 2021-05-21 DIAGNOSIS — E039 Hypothyroidism, unspecified: Secondary | ICD-10-CM

## 2021-05-21 DIAGNOSIS — I10 Essential (primary) hypertension: Secondary | ICD-10-CM

## 2021-05-21 DIAGNOSIS — Z125 Encounter for screening for malignant neoplasm of prostate: Secondary | ICD-10-CM

## 2021-05-21 DIAGNOSIS — E559 Vitamin D deficiency, unspecified: Secondary | ICD-10-CM

## 2021-05-21 LAB — URINALYSIS
Bilirubin, UA: NEGATIVE
Glucose, UA: NEGATIVE
Ketones, UA: NEGATIVE
Leukocytes,UA: NEGATIVE
Nitrite, UA: NEGATIVE
Protein,UA: NEGATIVE
Specific Gravity, UA: 1.015 (ref 1.005–1.030)
Urobilinogen, Ur: 0.2 mg/dL (ref 0.2–1.0)
pH, UA: 6 (ref 5.0–7.5)

## 2021-05-21 LAB — CBC WITH DIFFERENTIAL/PLATELET
Basophils Absolute: 0.1 10*3/uL (ref 0.0–0.2)
Basos: 1 %
EOS (ABSOLUTE): 0.2 10*3/uL (ref 0.0–0.4)
Eos: 3 %
Hematocrit: 51 % (ref 37.5–51.0)
Hemoglobin: 17.2 g/dL (ref 13.0–17.7)
Immature Grans (Abs): 0 10*3/uL (ref 0.0–0.1)
Immature Granulocytes: 0 %
Lymphocytes Absolute: 2.9 10*3/uL (ref 0.7–3.1)
Lymphs: 48 %
MCH: 29.2 pg (ref 26.6–33.0)
MCHC: 33.7 g/dL (ref 31.5–35.7)
MCV: 86 fL (ref 79–97)
Monocytes Absolute: 0.6 10*3/uL (ref 0.1–0.9)
Monocytes: 11 %
Neutrophils Absolute: 2.2 10*3/uL (ref 1.4–7.0)
Neutrophils: 37 %
Platelets: 281 10*3/uL (ref 150–450)
RBC: 5.9 x10E6/uL — ABNORMAL HIGH (ref 4.14–5.80)
RDW: 13.1 % (ref 11.6–15.4)
WBC: 5.9 10*3/uL (ref 3.4–10.8)

## 2021-05-21 NOTE — Addendum Note (Signed)
Addended by: Baldomero Lamy B on: 05/21/2021 09:11 AM   Modules accepted: Orders

## 2021-05-21 NOTE — Progress Notes (Signed)
Subjective:  Patient ID: James Blackburn, male    DOB: 01-11-1976  Age: 45 y.o. MRN: [id number]  CC: Annual Exam     HPI LARUE LIGHTNER presents for CPE   presents for  follow-up of hypertension. Patient has no history of headache chest pain or shortness of breath or recent cough. Patient also denies symptoms of TIA such as focal numbness or weakness. Patient denies side effects from medication. States taking it regularly.   follow-up on  thyroid. The patient has a history of hypothyroidism for many years. It has been stable recently. Pt. denies any change in  voice, loss of hair, heat or cold intolerance. Energy level has been adequate to good. Patient denies constipation and diarrhea. No myxedema. Medication is as noted below. Verified that pt is taking it daily on an empty stomach. Well tolerated.   Depression screen [facility] 2/9 05/21/2021 11/26/2020 08/20/2020  Decreased Interest 0 0 0  Down, Depressed, Hopeless 0 0 0  PHQ - 2 Score 0 0 0    History Amro has a past medical history of Anal fissure (2008), H/O hiatal hernia, Hyperlipidemia, Hypertension, Hypothyroidism, and Nephrolithiasis.   He has a past surgical history that includes Vasectomy and Tonsillectomy.   His family history includes COPD in his mother; Healthy in his daughter, sister, and son; Hypertension in his paternal grandfather; Pneumonia in his father.He reports that he has never smoked. He has never used smokeless tobacco. He reports that he does not drink alcohol and does not use drugs.    ROS Review of Systems  Constitutional: Negative for activity change, fatigue and unexpected weight change.  HENT: Negative for congestion, ear pain, hearing loss, postnasal drip and trouble swallowing.   Eyes: Negative for pain and visual disturbance.  Respiratory: Negative for cough, chest tightness and shortness of breath.   Cardiovascular: Negative for chest pain, palpitations and leg swelling.   Gastrointestinal: Negative for abdominal distention, abdominal pain, blood in stool, constipation, diarrhea, nausea and vomiting.  Endocrine: Negative for cold intolerance, heat intolerance and polydipsia.  Genitourinary: Negative for difficulty urinating, dysuria, flank pain, frequency and urgency.  Musculoskeletal: Negative for arthralgias and joint swelling.  Skin: Negative for color change, rash and wound.  Neurological: Negative for dizziness, syncope, speech difficulty, weakness, light-headedness, numbness and headaches.  Hematological: Does not bruise/bleed easily.  Psychiatric/Behavioral: Negative for confusion, decreased concentration, dysphoric mood and sleep disturbance. The patient is not nervous/anxious.     Objective:  BP (!) 138/93   Pulse 94   Temp 98 F (36.7 C)   Ht '6\' 1"'  (1.854 m)   Wt 231 lb 6.4 oz (105 kg)   SpO2 94%   BMI 30.53 kg/m   BP Readings from Last 3 Encounters:  05/21/21 (!) 138/93  11/26/20 127/83  08/20/20 129/86    Wt Readings from Last 3 Encounters:  05/21/21 231 lb 6.4 oz (105 kg)  11/26/20 235 lb 3.2 oz (106.7 kg)  08/20/20 238 lb 6.4 oz (108.1 kg)     Physical Exam Constitutional:      Appearance: He is well-developed.  HENT:     Head: Normocephalic and atraumatic.  Eyes:     Pupils: Pupils are equal, round, and reactive to light.  Neck:     Thyroid: No thyromegaly.     Trachea: No tracheal deviation.  Cardiovascular:     Rate and Rhythm: Normal rate and regular rhythm.     Heart sounds: Normal heart sounds. No murmur heard. No friction  rub. No gallop.   Pulmonary:     Breath sounds: Normal breath sounds. No wheezing or rales.  Abdominal:     General: Bowel sounds are normal. There is no distension.     Palpations: Abdomen is soft. There is no mass.     Tenderness: There is no abdominal tenderness.     Hernia: There is no hernia in the left inguinal area.  Genitourinary:    Penis: Normal.      Testes: Normal.   Musculoskeletal:        General: Normal range of motion.     Cervical back: Normal range of motion.  Lymphadenopathy:     Cervical: No cervical adenopathy.  Skin:    General: Skin is warm and dry.  Neurological:     Mental Status: He is alert and oriented to person, place, and time.       Assessment & Plan:   Radames was seen today for annual exam.  Diagnoses and all orders for this visit:  Vitamin D deficiency -     VITAMIN D 25 Hydroxy (Vit-D Deficiency, Fractures)  Primary hypertension -     Urinalysis -     CMP14+EGFR -     CBC with Differential/Platelet  Hypothyroidism, unspecified type -     TSH + free T4 -     CMP14+EGFR -     CBC with Differential/Platelet  Screening for prostate cancer -     PSA, total and free  Pure hypercholesterolemia -     Lipid panel -     CMP14+EGFR -     CBC with Differential/Platelet       I have discontinued Rashawd D. Komperda "Dale"'s icosapent Ethyl and predniSONE. I am also having him maintain his lisinopril and levothyroxine.  Allergies as of 05/21/2021      Reactions   Entex T [pseudoephedrine-guaifenesin] Other (See Comments)   "Makes my legs ache"   Fenofibrate Other (See Comments)   myalgias      Medication List       Accurate as of May 21, 2021  2:46 PM. If you have any questions, ask your nurse or doctor.        STOP taking these medications   icosapent Ethyl 1 g capsule Commonly known as: Vascepa Stopped by: Claretta Fraise, MD   predniSONE 20 MG tablet Commonly known as: DELTASONE Stopped by: Claretta Fraise, MD     TAKE these medications   levothyroxine 75 MCG tablet Commonly known as: SYNTHROID Take 1 tablet (75 mcg total) by mouth daily.   lisinopril 20 MG tablet Commonly known as: ZESTRIL TAKE 1 TABLET BY MOUTH DAILY        Follow-up: Return in about 6 months (around 11/21/2021).  Claretta Fraise, M.D.

## 2021-05-22 LAB — CMP14+EGFR
ALT: 47 IU/L — ABNORMAL HIGH (ref 0–44)
AST: 38 IU/L (ref 0–40)
Albumin/Globulin Ratio: 1.8 (ref 1.2–2.2)
Albumin: 4.7 g/dL (ref 4.0–5.0)
Alkaline Phosphatase: 82 IU/L (ref 44–121)
BUN/Creatinine Ratio: 15 (ref 9–20)
BUN: 12 mg/dL (ref 6–24)
Bilirubin Total: 0.6 mg/dL (ref 0.0–1.2)
CO2: 22 mmol/L (ref 20–29)
Calcium: 9.3 mg/dL (ref 8.7–10.2)
Chloride: 104 mmol/L (ref 96–106)
Creatinine, Ser: 0.8 mg/dL (ref 0.76–1.27)
Globulin, Total: 2.6 g/dL (ref 1.5–4.5)
Glucose: 101 mg/dL — ABNORMAL HIGH (ref 65–99)
Potassium: 4.2 mmol/L (ref 3.5–5.2)
Sodium: 138 mmol/L (ref 134–144)
Total Protein: 7.3 g/dL (ref 6.0–8.5)
eGFR: 112 mL/min/{1.73_m2} (ref >59)

## 2021-05-22 LAB — PSA, TOTAL AND FREE
PSA, Free Pct: 60 %
PSA, Free: 0.3 ng/mL
Prostate Specific Ag, Serum: 0.5 ng/mL (ref 0.0–4.0)

## 2021-05-22 LAB — LIPID PANEL
Chol/HDL Ratio: 7.1 ratio — ABNORMAL HIGH (ref 0.0–5.0)
Cholesterol, Total: 193 mg/dL (ref 100–199)
HDL: 27 mg/dL — ABNORMAL LOW (ref >39)
LDL Chol Calc (NIH): 125 mg/dL — ABNORMAL HIGH (ref 0–99)
Triglycerides: 231 mg/dL — ABNORMAL HIGH (ref 0–149)
VLDL Cholesterol Cal: 41 mg/dL — ABNORMAL HIGH (ref 5–40)

## 2021-05-22 LAB — VITAMIN D 25 HYDROXY (VIT D DEFICIENCY, FRACTURES): Vit D, 25-Hydroxy: 17.8 ng/mL — ABNORMAL LOW (ref 30.0–100.0)

## 2021-05-22 LAB — TSH+FREE T4
Free T4: 0.92 ng/dL (ref 0.82–1.77)
TSH: 2.41 u[IU]/mL (ref 0.450–4.500)

## 2021-05-26 NOTE — Progress Notes (Signed)
Dear James Blackburn, Your Vitamin D is  low. You need a prescription strength supplement I will send that in for you.  Also th HDL, good, cholesterol is very low. This responds best to weight loss and regular exercise balanced between cardio and resistance (weight) training Nurse, if at all possible, could you send in a prescription for the patient for vitamin D 50,000 units, 1 p.o. weekly #13 with 3 refills? Many thanks, WS

## 2021-05-27 MED ORDER — VITAMIN D (ERGOCALCIFEROL) 1.25 MG (50000 UNIT) PO CAPS: 50000.0000 [IU] | ORAL_CAPSULE | ORAL | 3 refills | Status: DC

## 2021-05-27 NOTE — Addendum Note (Signed)
Addended by: Ladean Raya on: 05/27/2021 04:55 PM   Modules accepted: Orders

## 2021-06-01 ENCOUNTER — Other Ambulatory Visit: Payer: Self-pay | Admitting: Family Medicine

## 2021-06-01 DIAGNOSIS — E039 Hypothyroidism, unspecified: Secondary | ICD-10-CM

## 2021-06-01 DIAGNOSIS — I10 Essential (primary) hypertension: Secondary | ICD-10-CM

## 2021-11-25 ENCOUNTER — Other Ambulatory Visit: Payer: Self-pay | Admitting: Family Medicine

## 2021-11-25 DIAGNOSIS — I10 Essential (primary) hypertension: Secondary | ICD-10-CM

## 2021-12-23 ENCOUNTER — Other Ambulatory Visit: Payer: Self-pay | Admitting: Family Medicine

## 2021-12-23 DIAGNOSIS — I10 Essential (primary) hypertension: Secondary | ICD-10-CM

## 2021-12-25 ENCOUNTER — Telehealth: Payer: Self-pay | Admitting: Family Medicine

## 2021-12-25 DIAGNOSIS — I10 Essential (primary) hypertension: Secondary | ICD-10-CM

## 2021-12-25 NOTE — Telephone Encounter (Signed)
Okay for refills for one month. I see all of my patients every six months if they are on multiple meds for multiple conditions. Please ask him to schedule.

## 2021-12-25 NOTE — Telephone Encounter (Signed)
Spouse called says pt should have a yr supply for lisinopril (ZESTRIL) 20 MG tablet. She wants to know why this happens all the time that she has to call for refills. Pt was last seen 05/21/2021 and he schedules to come once a yr for CPE. Use James Blackburn in [place].

## 2021-12-25 NOTE — Telephone Encounter (Signed)
Informed wife that per Stacks last OV note that he wanted pt to return in 51m. She is not sure why if his medications have not changed. Informed of labs results last May. HDL was low.  Dr. Livia Snellen,  Need to confirm if pt can wait until May for follow up or does he need to come sooner than a year like you suggested back in May.  Also, can refills of Lisinopril be sent?

## 2021-12-26 MED ORDER — LISINOPRIL 20 MG PO TABS: 20.0000 mg | ORAL_TABLET | Freq: Every day | ORAL | 0 refills | Status: DC

## 2021-12-26 NOTE — Telephone Encounter (Signed)
Wife aware - 30 day supply sent and patient will call to schedule apt.

## 2022-02-05 ENCOUNTER — Telehealth: Payer: Self-pay | Admitting: Family Medicine

## 2022-02-05 DIAGNOSIS — E039 Hypothyroidism, unspecified: Secondary | ICD-10-CM

## 2022-02-05 DIAGNOSIS — I10 Essential (primary) hypertension: Secondary | ICD-10-CM

## 2022-02-05 MED ORDER — LISINOPRIL 20 MG PO TABS: 20.0000 mg | ORAL_TABLET | Freq: Every day | ORAL | 0 refills | Status: DC

## 2022-02-05 MED ORDER — LEVOTHYROXINE SODIUM 75 MCG PO TABS: 75.0000 ug | ORAL_TABLET | Freq: Every day | ORAL | 0 refills | Status: DC

## 2022-02-05 NOTE — Telephone Encounter (Signed)
°  Prescription Request  02/05/2022  Is this a "Controlled Substance" medicine? no  Have you seen your PCP in the last 2 weeks? No, appt made   If YES, route message to pool  -  If NO, patient needs to be scheduled for appointment.  What is the name of the medication or equipment? levothyroxine (SYNTHROID) 75 MCG tablet lisinopril (ZESTRIL) 20 MG tablet  Have you contacted your pharmacy to request a refill? yes   Which pharmacy would you like this sent to?  [facility] [id number] - [address], [place] (Ph: [phone])  Patient notified that their request is being sent to the clinical staff for review and that they should receive a response within 2 business days.

## 2022-02-05 NOTE — Telephone Encounter (Signed)
Aware refill sent to pharmacy ?

## 2022-02-17 ENCOUNTER — Encounter: Payer: Self-pay | Admitting: Family Medicine

## 2022-02-17 ENCOUNTER — Ambulatory Visit: Payer: Managed Care, Other (non HMO) | Admitting: Family Medicine

## 2022-02-17 VITALS — BP 126/80 | HR 76 | Temp 97.6°F | Ht 73.0 in | Wt 240.6 lb

## 2022-02-17 DIAGNOSIS — E559 Vitamin D deficiency, unspecified: Secondary | ICD-10-CM | POA: Diagnosis not present

## 2022-02-17 DIAGNOSIS — E7800 Pure hypercholesterolemia, unspecified: Secondary | ICD-10-CM | POA: Diagnosis not present

## 2022-02-17 DIAGNOSIS — E039 Hypothyroidism, unspecified: Secondary | ICD-10-CM | POA: Diagnosis not present

## 2022-02-17 DIAGNOSIS — I10 Essential (primary) hypertension: Secondary | ICD-10-CM | POA: Diagnosis not present

## 2022-02-17 MED ORDER — LEVOTHYROXINE SODIUM 75 MCG PO TABS: 75.0000 ug | ORAL_TABLET | Freq: Every day | ORAL | 3 refills | Status: DC

## 2022-02-17 MED ORDER — LISINOPRIL 20 MG PO TABS: 20.0000 mg | ORAL_TABLET | Freq: Every day | ORAL | 3 refills | Status: DC

## 2022-02-17 NOTE — Progress Notes (Signed)
Subjective:  Patient ID: James Blackburn, male    DOB: 10-25-1976  Age: 46 y.o. MRN: [id number]  CC: Medical Management of Chronic Issues   HPI James Blackburn presents for patient presents for follow-up on  thyroid. The patient has a history of hypothyroidism for many years. It has been stable recently. Pt. denies any change in  voice, loss of hair, heat or cold intolerance. Energy level has been adequate to good. Patient denies constipation and diarrhea. No myxedema. Medication is as noted below. Verified that pt is taking it daily on an empty stomach. Well tolerated. Patient in for follow-up of elevated cholesterol. Doing well without complaints on current medication. Denies side effects of statin including myalgia and arthralgia and nausea. Also in today for liver function testing. Currently no chest pain, shortness of breath or other cardiovascular related symptoms noted.  Follow-up of hypertension. Patient has no history of headache chest pain or shortness of breath or recent cough. Patient also denies symptoms of TIA such as numbness weakness lateralizing. Patient checks  blood pressure at home and has not had any elevated readings recently. Patient denies side effects from his medication. States taking it regularly.   Depression screen [facility] 2/9 02/17/2022 05/21/2021 11/26/2020  Decreased Interest 0 0 0  Down, Depressed, Hopeless 0 0 0  PHQ - 2 Score 0 0 0    History James Blackburn has a past medical history of Anal fissure (2008), H/O hiatal hernia, Hyperlipidemia, Hypertension, Hypothyroidism, and Nephrolithiasis.   James Blackburn has a past surgical history that includes Vasectomy and Tonsillectomy.   His family history includes COPD in his mother; Healthy in his daughter, sister, and son; Hypertension in his paternal grandfather; Pneumonia in his father.James Blackburn reports that James Blackburn has never smoked. James Blackburn has never used smokeless tobacco. James Blackburn reports that James Blackburn does not drink alcohol and does not use  drugs.    ROS Review of Systems  Constitutional:  Negative for fever.  Respiratory:  Negative for shortness of breath.   Cardiovascular:  Negative for chest pain.  Musculoskeletal:  Negative for arthralgias.  Skin:  Negative for rash.   Objective:  BP 126/80    Pulse 76    Temp 97.6 F (36.4 C)    Ht _0  (1.854 m)    Wt 240 lb 9.6 oz (109.1 kg)    SpO2 93%    BMI 31.74 kg/m   BP Readings from Last 3 Encounters:  02/17/22 126/80  05/21/21 (!) 138/93  11/26/20 127/83    Wt Readings from Last 3 Encounters:  02/17/22 240 lb 9.6 oz (109.1 kg)  05/21/21 231 lb 6.4 oz (105 kg)  11/26/20 235 lb 3.2 oz (106.7 kg)     Physical Exam Vitals reviewed.  Constitutional:      Appearance: James Blackburn is well-developed.  HENT:     Head: Normocephalic and atraumatic.     Right Ear: External ear normal.     Left Ear: External ear normal.     Mouth/Throat:     Pharynx: No oropharyngeal exudate or posterior oropharyngeal erythema.  Eyes:     Pupils: Pupils are equal, round, and reactive to light.  Cardiovascular:     Rate and Rhythm: Normal rate and regular rhythm.     Heart sounds: No murmur heard. Pulmonary:     Effort: No respiratory distress.     Breath sounds: Normal breath sounds.  Musculoskeletal:     Cervical back: Normal range of motion and neck supple.  Neurological:  Mental Status: James Blackburn is alert and oriented to person, place, and time.      Assessment & Plan:   James Blackburn was seen today for medical management of chronic issues.  Diagnoses and all orders for this visit:  Hypothyroidism, unspecified type -     TSH + free T4 -     levothyroxine (SYNTHROID) 75 MCG tablet; Take 1 tablet (75 mcg total) by mouth daily.  Essential hypertension -     CBC with Differential/Platelet -     CMP14+EGFR -     lisinopril (ZESTRIL) 20 MG tablet; Take 1 tablet (20 mg total) by mouth daily.  Pure hypercholesterolemia -     Lipid panel  Vitamin D deficiency -     VITAMIN D 25  Hydroxy (Vit-D Deficiency, Fractures)       I am having James Blackburn "James Blackburn" maintain his Vitamin D (Ergocalciferol), levothyroxine, and lisinopril.  Allergies as of 02/17/2022       Reactions   Entex T [pseudoephedrine-guaifenesin] Other (See Comments)   "Makes my legs ache"   Fenofibrate Other (See Comments)   myalgias        Medication List        Accurate as of February 17, 2022  7:47 PM. If you have any questions, ask your nurse or doctor.          levothyroxine 75 MCG tablet Commonly known as: SYNTHROID Take 1 tablet (75 mcg total) by mouth daily.   lisinopril 20 MG tablet Commonly known as: ZESTRIL Take 1 tablet (20 mg total) by mouth daily.   Vitamin D (Ergocalciferol) 1.25 MG (50000 UNIT) Caps capsule Commonly known as: DRISDOL Take 1 capsule (50,000 Units total) by mouth every 7 (seven) days.         Follow-up: Return in about 6 months (around 08/17/2022).  Claretta Fraise, M.D.

## 2022-02-18 LAB — CMP14+EGFR
ALT: 57 IU/L — ABNORMAL HIGH (ref 0–44)
AST: 47 IU/L — ABNORMAL HIGH (ref 0–40)
Albumin/Globulin Ratio: 1.8 (ref 1.2–2.2)
Albumin: 4.7 g/dL (ref 4.0–5.0)
Alkaline Phosphatase: 76 IU/L (ref 44–121)
BUN/Creatinine Ratio: 15 (ref 9–20)
BUN: 12 mg/dL (ref 6–24)
Bilirubin Total: 0.7 mg/dL (ref 0.0–1.2)
CO2: 21 mmol/L (ref 20–29)
Calcium: 9.1 mg/dL (ref 8.7–10.2)
Chloride: 102 mmol/L (ref 96–106)
Creatinine, Ser: 0.78 mg/dL (ref 0.76–1.27)
Globulin, Total: 2.6 g/dL (ref 1.5–4.5)
Glucose: 89 mg/dL (ref 70–99)
Potassium: 4.1 mmol/L (ref 3.5–5.2)
Sodium: 137 mmol/L (ref 134–144)
Total Protein: 7.3 g/dL (ref 6.0–8.5)
eGFR: 112 mL/min/{1.73_m2} (ref >59)

## 2022-02-18 LAB — CBC WITH DIFFERENTIAL/PLATELET
Basophils Absolute: 0.1 10*3/uL (ref 0.0–0.2)
Basos: 1 %
EOS (ABSOLUTE): 0.2 10*3/uL (ref 0.0–0.4)
Eos: 3 %
Hematocrit: 49.6 % (ref 37.5–51.0)
Hemoglobin: 17.1 g/dL (ref 13.0–17.7)
Immature Grans (Abs): 0 10*3/uL (ref 0.0–0.1)
Immature Granulocytes: 0 %
Lymphocytes Absolute: 3 10*3/uL (ref 0.7–3.1)
Lymphs: 46 %
MCH: 30.1 pg (ref 26.6–33.0)
MCHC: 34.5 g/dL (ref 31.5–35.7)
MCV: 87 fL (ref 79–97)
Monocytes Absolute: 0.9 10*3/uL (ref 0.1–0.9)
Monocytes: 14 %
Neutrophils Absolute: 2.3 10*3/uL (ref 1.4–7.0)
Neutrophils: 36 %
Platelets: 285 10*3/uL (ref 150–450)
RBC: 5.69 x10E6/uL (ref 4.14–5.80)
RDW: 13.1 % (ref 11.6–15.4)
WBC: 6.3 10*3/uL (ref 3.4–10.8)

## 2022-02-18 LAB — LIPID PANEL
Chol/HDL Ratio: 7.4 ratio — ABNORMAL HIGH (ref 0.0–5.0)
Cholesterol, Total: 185 mg/dL (ref 100–199)
HDL: 25 mg/dL — ABNORMAL LOW (ref >39)
LDL Chol Calc (NIH): 118 mg/dL — ABNORMAL HIGH (ref 0–99)
Triglycerides: 239 mg/dL — ABNORMAL HIGH (ref 0–149)
VLDL Cholesterol Cal: 42 mg/dL — ABNORMAL HIGH (ref 5–40)

## 2022-02-18 LAB — VITAMIN D 25 HYDROXY (VIT D DEFICIENCY, FRACTURES): Vit D, 25-Hydroxy: 15.9 ng/mL — ABNORMAL LOW (ref 30.0–100.0)

## 2022-02-18 LAB — TSH+FREE T4
Free T4: 1.09 ng/dL (ref 0.82–1.77)
TSH: 1.99 u[IU]/mL (ref 0.450–4.500)

## 2022-02-18 NOTE — Progress Notes (Signed)
Dear James Blackburn, Your Vitamin D is  low. You need a prescription strength supplement I will send that in for you. Nurse, if at all possible, could you send in a prescription for the patient for vitamin D 50,000 units, 1 p.o. weekly #13 with 3 refills? Many thanks, WS

## 2022-04-08 ENCOUNTER — Encounter: Payer: Self-pay | Admitting: Family Medicine

## 2022-04-08 ENCOUNTER — Ambulatory Visit: Payer: Managed Care, Other (non HMO) | Admitting: Family Medicine

## 2022-04-08 VITALS — BP 135/89 | HR 75 | Temp 97.6°F | Resp 20 | Ht 73.0 in | Wt 239.0 lb

## 2022-04-08 DIAGNOSIS — M778 Other enthesopathies, not elsewhere classified: Secondary | ICD-10-CM

## 2022-04-08 MED ORDER — NAPROXEN 500 MG PO TABS: 500.0000 mg | ORAL_TABLET | Freq: Two times a day (BID) | ORAL | 0 refills | Status: AC

## 2022-04-08 MED ORDER — METHYLPREDNISOLONE ACETATE 40 MG/ML IJ SUSP
40.0000 mg | Freq: Once | INTRAMUSCULAR | Status: AC
  Administered 2022-04-08: 40 mg via INTRAMUSCULAR

## 2022-04-08 NOTE — Addendum Note (Signed)
Addended by: Rolena Infante on: 04/08/2022 12:07 PM ? ? Modules accepted: Orders ? ?

## 2022-04-08 NOTE — Progress Notes (Signed)
?  ? ?Subjective:  ?Patient ID: James Blackburn, male    DOB: 22-Oct-1976, 46 y.o.   MRN: [id number] ? ?Patient Care Team: ?Claretta Fraise, MD as PCP - General (Family Medicine) ?Patient, No Pcp Per (Inactive) (General Practice)  ? ?Chief Complaint:  Right arm pain (Started this weekend/) ? ? ?HPI: ?James Blackburn is a 46 y.o. male presenting on 04/08/2022 for Right arm pain (Started this weekend/) ? ? ?Pt reports right upper arm pain since Saturday. No reported injuries or heavy lifting. No new activities. States arm just started aching and then becomes painful when he lifts arm up. States aching and burning, no associated rash, swelling, or erythema. Has tried Tylenol without relief of symptoms.  ? ? ?Relevant past medical, surgical, family, and social history reviewed and updated as indicated.  ?Allergies and medications reviewed and updated. Data reviewed: Chart in Epic. ? ? ?Past Medical History:  ?Diagnosis Date  ? Anal fissure 2008  ? per Dr Day  ? H/O hiatal hernia   ? Hyperlipidemia   ? Hypertension   ? Hypothyroidism   ? Nephrolithiasis   ? ? ?Past Surgical History:  ?Procedure Laterality Date  ? TONSILLECTOMY    ? VASECTOMY    ? ? ?Social History  ? ?Socioeconomic History  ? Marital status: Married  ?  Spouse name: Not on file  ? Number of children: 2  ? Years of education: Not on file  ? Highest education level: Not on file  ?Occupational History  ? Occupation: Clinical research associate  ?  Employer: [employer]  ?Tobacco Use  ? Smoking status: Never  ? Smokeless tobacco: Never  ?Vaping Use  ? Vaping Use: Never used  ?Substance and Sexual Activity  ? Alcohol use: No  ? Drug use: No  ? Sexual activity: Not on file  ?Other Topics Concern  ? Not on file  ?Social History Narrative  ? 3 caffeine drinks daily   ? Lives with wife and children in one story home.  ? High school education.  ? 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  ? ?Social Determinants of Health  ? ?Financial Resource Strain: Not on file  ?Food Insecurity:  Not on file  ?Transportation Needs: Not on file  ?Physical Activity: Not on file  ?Stress: Not on file  ?Social Connections: Not on file  ?Intimate Partner Violence: Not on file  ? ? ?Outpatient Encounter Medications as of 04/08/2022  ?Medication Sig  ? levothyroxine (SYNTHROID) 75 MCG tablet Take 1 tablet (75 mcg total) by mouth daily.  ? lisinopril (ZESTRIL) 20 MG tablet Take 1 tablet (20 mg total) by mouth daily.  ? naproxen (NAPROSYN) 500 MG tablet Take 1 tablet (500 mg total) by mouth 2 (two) times daily with a meal for 14 days.  ? VITAMIN D PO Take by mouth.  ? [DISCONTINUED] Vitamin D, Ergocalciferol, (DRISDOL) 1.25 MG (50000 UNIT) CAPS capsule Take 1 capsule (50,000 Units total) by mouth every 7 (seven) days.  ? ?No facility-administered encounter medications on file as of 04/08/2022.  ? ? ?Allergies  ?Allergen Reactions  ? Entex T [Pseudoephedrine-Guaifenesin] Other (See Comments)  ?  "Makes my legs ache"  ? Fenofibrate Other (See Comments)  ?  myalgias  ? ? ?Review of Systems  ?Constitutional:  Negative for activity change, appetite change, chills, diaphoresis, fatigue, fever and unexpected weight change.  ?Respiratory:  Negative for cough and shortness of breath.   ?Cardiovascular:  Negative for chest pain, palpitations and leg swelling.  ?Musculoskeletal:  Positive for myalgias. Negative for arthralgias, back pain, gait problem, joint swelling, neck pain and neck stiffness.  ?Neurological:  Negative for weakness.  ?Psychiatric/Behavioral:  Negative for confusion.   ?All other systems reviewed and are negative. ? ?   ? ?Objective:  ?BP 135/89   Pulse 75   Temp 97.6 ?F (36.4 ?C) (Temporal)   Resp 20   Ht '6\' 1"'  (1.854 m)   Wt 239 lb (108.4 kg)   SpO2 92%   BMI 31.53 kg/m?   ? ?Wt Readings from Last 3 Encounters:  ?04/08/22 239 lb (108.4 kg)  ?02/17/22 240 lb 9.6 oz (109.1 kg)  ?05/21/21 231 lb 6.4 oz (105 kg)  ? ? ?Physical Exam ?Vitals and nursing note reviewed.  ?Constitutional:   ?   General: He is  not in acute distress. ?   Appearance: Normal appearance. He is well-developed and well-groomed. He is not ill-appearing, toxic-appearing or diaphoretic.  ?HENT:  ?   Head: Normocephalic and atraumatic.  ?   Jaw: There is normal jaw occlusion.  ?   Right Ear: Hearing normal.  ?   Left Ear: Hearing normal.  ?   Nose: Nose normal.  ?   Mouth/Throat:  ?   Lips: Pink.  ?   Mouth: Mucous membranes are moist.  ?   Pharynx: Uvula midline.  ?Eyes:  ?   General: Lids are normal.  ?   Extraocular Movements: Extraocular movements intact.  ?   Conjunctiva/sclera: Conjunctivae normal.  ?   Pupils: Pupils are equal, round, and reactive to light.  ?Neck:  ?   Thyroid: No thyroid mass, thyromegaly or thyroid tenderness.  ?   Vascular: No carotid bruit or JVD.  ?   Trachea: Trachea and phonation normal.  ?Cardiovascular:  ?   Rate and Rhythm: Normal rate and regular rhythm.  ?   Chest Wall: PMI is not displaced.  ?   Pulses: Normal pulses.  ?   Heart sounds: Normal heart sounds. No murmur heard. ?  No friction rub. No gallop.  ?Pulmonary:  ?   Effort: Pulmonary effort is normal.  ?   Breath sounds: Normal breath sounds.  ?Abdominal:  ?   General: Bowel sounds are normal. There is no abdominal bruit.  ?   Palpations: Abdomen is soft. There is no hepatomegaly or splenomegaly.  ?Musculoskeletal:     ?   General: Normal range of motion.  ?   Right shoulder: Normal.  ?   Left shoulder: Normal.  ?   Right upper arm: Tenderness present. No swelling, edema, deformity, lacerations or bony tenderness.  ?   Right elbow: Normal.  ?     Arms: ? ?   Cervical back: Normal range of motion and neck supple.  ?   Right lower leg: No edema.  ?   Left lower leg: No edema.  ?Lymphadenopathy:  ?   Cervical: No cervical adenopathy.  ?Skin: ?   General: Skin is warm and dry.  ?   Capillary Refill: Capillary refill takes less than 2 seconds.  ?   Coloration: Skin is not cyanotic, jaundiced or pale.  ?   Findings: No rash.  ?Neurological:  ?   General: No  focal deficit present.  ?   Mental Status: He is alert and oriented to person, place, and time.  ?   Sensory: Sensation is intact.  ?   Motor: Motor function is intact.  ?   Coordination: Coordination is intact.  ?  Gait: Gait is intact.  ?   Deep Tendon Reflexes: Reflexes are normal and symmetric.  ?Psychiatric:     ?   Attention and Perception: Attention and perception normal.     ?   Mood and Affect: Mood and affect normal.     ?   Speech: Speech normal.     ?   Behavior: Behavior normal. Behavior is cooperative.     ?   Thought Content: Thought content normal.     ?   Cognition and Memory: Cognition and memory normal.     ?   Judgment: Judgment normal.  ? ? ?Results for orders placed or performed in visit on 02/17/22  ?CBC with Differential/Platelet  ?Result Value Ref Range  ? WBC 6.3 3.4 - 10.8 x10E3/uL  ? RBC 5.69 4.14 - 5.80 x10E6/uL  ? Hemoglobin 17.1 13.0 - 17.7 g/dL  ? Hematocrit 49.6 37.5 - 51.0 %  ? MCV 87 79 - 97 fL  ? MCH 30.1 26.6 - 33.0 pg  ? MCHC 34.5 31.5 - 35.7 g/dL  ? RDW 13.1 11.6 - 15.4 %  ? Platelets 285 150 - 450 x10E3/uL  ? Neutrophils 36 Not Estab. %  ? Lymphs 46 Not Estab. %  ? Monocytes 14 Not Estab. %  ? Eos 3 Not Estab. %  ? Basos 1 Not Estab. %  ? Neutrophils Absolute 2.3 1.4 - 7.0 x10E3/uL  ? Lymphocytes Absolute 3.0 0.7 - 3.1 x10E3/uL  ? Monocytes Absolute 0.9 0.1 - 0.9 x10E3/uL  ? EOS (ABSOLUTE) 0.2 0.0 - 0.4 x10E3/uL  ? Basophils Absolute 0.1 0.0 - 0.2 x10E3/uL  ? Immature Granulocytes 0 Not Estab. %  ? Immature Grans (Abs) 0.0 0.0 - 0.1 x10E3/uL  ?CMP14+EGFR  ?Result Value Ref Range  ? Glucose 89 70 - 99 mg/dL  ? BUN 12 6 - 24 mg/dL  ? Creatinine, Ser 0.78 0.76 - 1.27 mg/dL  ? eGFR 112 >59 mL/min/1.73  ? BUN/Creatinine Ratio 15 9 - 20  ? Sodium 137 134 - 144 mmol/L  ? Potassium 4.1 3.5 - 5.2 mmol/L  ? Chloride 102 96 - 106 mmol/L  ? CO2 21 20 - 29 mmol/L  ? Calcium 9.1 8.7 - 10.2 mg/dL  ? Total Protein 7.3 6.0 - 8.5 g/dL  ? Albumin 4.7 4.0 - 5.0 g/dL  ? Globulin, Total 2.6 1.5 -  4.5 g/dL  ? Albumin/Globulin Ratio 1.8 1.2 - 2.2  ? Bilirubin Total 0.7 0.0 - 1.2 mg/dL  ? Alkaline Phosphatase 76 44 - 121 IU/L  ? AST 47 (H) 0 - 40 IU/L  ? ALT 57 (H) 0 - 44 IU/L  ?Lipid panel  ?Result Value

## 2022-05-18 ENCOUNTER — Other Ambulatory Visit: Payer: Self-pay | Admitting: Family Medicine

## 2022-05-18 ENCOUNTER — Ambulatory Visit: Payer: Managed Care, Other (non HMO) | Admitting: Family Medicine

## 2022-05-18 ENCOUNTER — Encounter: Payer: Self-pay | Admitting: Family Medicine

## 2022-05-18 VITALS — BP 140/90 | HR 68 | Temp 97.7°F | Ht 73.0 in | Wt 238.4 lb

## 2022-05-18 DIAGNOSIS — T300 Burn of unspecified body region, unspecified degree: Secondary | ICD-10-CM

## 2022-05-18 DIAGNOSIS — S6991XA Unspecified injury of right wrist, hand and finger(s), initial encounter: Secondary | ICD-10-CM

## 2022-05-18 DIAGNOSIS — T24232A Burn of second degree of left lower leg, initial encounter: Secondary | ICD-10-CM | POA: Diagnosis not present

## 2022-05-18 MED ORDER — SILVER SULFADIAZINE 1 % EX CREA: 1.0000 "application " | TOPICAL_CREAM | Freq: Every day | CUTANEOUS | 0 refills | Status: DC

## 2022-05-18 MED ORDER — LIDOCAINE HCL 2.5 % EX GEL: CUTANEOUS | 0 refills | Status: DC

## 2022-05-18 MED ORDER — CEPHALEXIN 500 MG PO CAPS: 500.0000 mg | ORAL_CAPSULE | Freq: Four times a day (QID) | ORAL | 0 refills | Status: AC

## 2022-05-18 MED ORDER — LIDOCAINE HCL 4 % EX GEL: CUTANEOUS | 0 refills | Status: DC

## 2022-05-18 NOTE — Telephone Encounter (Signed)
4% sent

## 2022-05-18 NOTE — Patient Instructions (Signed)
Burn Care, Adult A burn is an injury to the skin or the tissues under the skin. There are three types of burns: First degree. These burns may cause the skin to be red and a bit swollen. Second degree. These burns are very painful and cause the skin to be very red. The skin may also swell, leak fluid, look shiny, and start to have blisters. Third degree. These burns cause lasting damage. They turn the skin white or black and make it look charred, dry, and leathery. Treatment for your burn will depend on the type of burn you have. Taking good care of your burn can help to prevent pain and infection. It can also help the burn heal quickly. How to care for a first-degree burn Right after a burn: Rinse or soak the burn under cool water for 5 minutes or more. Do not put ice on your burn. That can cause more damage. Put a cool, clean, wet cloth on your burn. Put lotion or gel with aloe vera on your burn. Caring for the burn Clean and care for your burn. Your doctor may tell you: To clean the burn using soap and water. To pat the burn dry using a clean cloth. Do not rub or scrub the burn. To put lotion or gel with aloe vera on your burn. How to care for a second-degree burn Right after a burn: Rinse or soak the burn under cool water. Do this for 5 to 10 minutes. Do not put ice on your burn. This can cause more damage. Remove any jewelry near the burned area. Cover the burn with a clean cloth. Caring for the burn Raise (elevate) the burned area above the level of your heart while sitting or lying down. Clean and care for your burn. Your doctor may tell you: To clean or rinse your burn. To put a cream or ointment on the burn. To place a germ-free (sterile) dressing over the burn. A dressing is a material that is placed on a burn to help it heal. How to care for a third-degree burn Right after a burn: Cover the burn with a clean, dry cloth. Seek treatment right away if you have this kind of burn.  You may: Need to stay in the hospital. Have surgery to remove burned tissue. Have surgery to put new skin on the burned area. Be given fluids through an IV tube. Caring for the burn Clean and care for your burn. Your doctor may tell you: To clean or rinse your burn. To put a cream or ointment on the burn. To put a sterile dressing in the burn. This is called packing. To place a sterile dressing over the burn. Other things to do Raise the burned area above the level of your heart while sitting or lying down. Wear splints or immobilizers if told by your doctor. Rest as told by your doctor. Do not do sports or other activities until your doctor approves. How to prevent infection when caring for a burn  Take these steps to prevent infection: Wash your hands with soap and water for at least 20 seconds before and after caring for your burn. If you cannot use soap and water, use hand sanitizer. Wear clean or sterile gloves as told by your doctor. Do not put butter, oil, toothpaste, or other home remedies on the burn. Do not scratch or pick at the burn. Do not break any blisters. Do not peel the skin. Do not rub your burn, even when   you are cleaning it. Check your burn every day for these signs of infection: More redness, swelling, or pain. Warmth. Pus or a bad smell. Red streaks around the burn. Follow these instructions at home Medicines Take over-the-counter and prescription medicines only as told by your doctor. If you were prescribed an antibiotic medicine, use it as told by your doctor. Do not stop using the antibiotic even if your condition gets better. Your doctor may ask you to take medicine for pain before you change your dressing. General instructions  Protect your burn from the sun. Drink enough fluid to keep your pee (urine) pale yellow. Do not use any products that contain nicotine or tobacco, such as cigarettes, e-cigarettes, and chewing tobacco. These can delay healing.  If you need help quitting, ask your doctor. Keep all follow-up visits as told by your doctor. This is important. Contact a doctor if: Your condition does not get better. Your condition gets worse. You have a fever or chills. Your burn feels warm to the touch. You have more redness, swelling, or pain on your burn. Your burn looks different or starts to have black or red spots on it. Your pain does not get better with medicine. Get help right away if: You have more fluid, blood, or pus coming from your burn. You have red streaks near the burn. You have very bad pain. Summary There are three types of burns. They are first degree, second degree, and third degree. Of these, a third-degree burn is most serious. This must be treated right away. Treatment for your burn will depend on the type of burn you have. Do not put butter, oil, toothpaste, or other home remedies on the burn. These things can damage your skin. Follow instructions from your doctor about how to clean and take care of your burn. This information is not intended to replace advice given to you by your health care provider. Make sure you discuss any questions you have with your health care provider. Document Revised: 02/02/2020 Document Reviewed: 10/03/2019 Elsevier Patient Education  2023 Elsevier Inc.  
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
## 2022-05-18 NOTE — Addendum Note (Signed)
Addended by: Janora Norlander on: 05/18/2022 05:03 PM   Modules accepted: Orders

## 2022-05-21 ENCOUNTER — Telehealth: Payer: Self-pay | Admitting: Family Medicine

## 2022-05-21 DIAGNOSIS — T300 Burn of unspecified body region, unspecified degree: Secondary | ICD-10-CM

## 2022-05-21 MED ORDER — LIDOCAINE 3 % EX CREA: TOPICAL_CREAM | CUTANEOUS | 0 refills | Status: DC

## 2022-05-21 NOTE — Telephone Encounter (Signed)
Davina called from [facility] stating that they do not have the Lidocaine 4% Gel in stock. Says they have Lidocaine 3% Cream which is most similar, if Dr Lajuana Ripple wants to send Rx in for that.

## 2022-10-12 ENCOUNTER — Ambulatory Visit: Payer: Managed Care, Other (non HMO) | Admitting: Nurse Practitioner

## 2022-10-12 ENCOUNTER — Encounter: Payer: Self-pay | Admitting: Nurse Practitioner

## 2022-10-12 VITALS — BP 134/96 | HR 97 | Temp 98.4°F | Ht 73.0 in | Wt 227.0 lb

## 2022-10-12 DIAGNOSIS — R051 Acute cough: Secondary | ICD-10-CM | POA: Diagnosis not present

## 2022-10-12 MED ORDER — BENZONATATE 100 MG PO CAPS: 100.0000 mg | ORAL_CAPSULE | Freq: Three times a day (TID) | ORAL | 0 refills | Status: DC | PRN

## 2022-10-12 NOTE — Patient Instructions (Signed)

## 2022-10-12 NOTE — Progress Notes (Signed)
Acute Office Visit  Subjective:     Patient ID: James Blackburn, male    DOB: [birth year], 46 y.o.   MRN: [id number]  Chief Complaint  Patient presents with   Cough    Been going on for about 3 days - worse at night     Cough This is a new problem. Episode onset: in the past 3-5 days. Pertinent negatives include no chills, ear pain, fever, rash, sore throat or weight loss.     Review of Systems  Constitutional: Negative.  Negative for chills, diaphoresis, fever, malaise/fatigue and weight loss.  HENT: Negative.  Negative for ear discharge, ear pain, hearing loss, sinus pain and sore throat.   Eyes: Negative.   Respiratory:  Positive for cough.   Cardiovascular: Negative.   Genitourinary: Negative.   Musculoskeletal: Negative.   Skin: Negative.  Negative for itching and rash.  All other systems reviewed and are negative.       Objective:    BP (!) 134/96   Pulse 97   Temp 98.4 F (36.9 C)   Ht '6\' 1"'$  (1.854 m)   Wt 227 lb (103 kg)   SpO2 96%   BMI 29.95 kg/m  BP Readings from Last 3 Encounters:  10/12/22 (!) 134/96  05/18/22 140/90  04/08/22 135/89   Wt Readings from Last 3 Encounters:  10/12/22 227 lb (103 kg)  05/18/22 238 lb 6.4 oz (108.1 kg)  04/08/22 239 lb (108.4 kg)      Physical Exam Vitals and nursing note reviewed.  Constitutional:      Appearance: He is normal weight.  HENT:     Head: Normocephalic.     Right Ear: External ear normal.     Left Ear: External ear normal.     Nose: Congestion present.     Mouth/Throat:     Mouth: Mucous membranes are moist.     Pharynx: Oropharynx is clear.  Eyes:     Conjunctiva/sclera: Conjunctivae normal.  Cardiovascular:     Rate and Rhythm: Normal rate and regular rhythm.     Pulses: Normal pulses.     Heart sounds: Normal heart sounds.  Pulmonary:     Effort: Pulmonary effort is normal.     Breath sounds: Normal breath sounds.  Abdominal:     General: Bowel sounds are normal.  Skin:     General: Skin is warm.     Findings: No erythema or rash.  Neurological:     Mental Status: He is alert and oriented to person, place, and time.     No results found for any visits on 10/12/22.      Assessment & Plan:  Patient presents with unresolved cough, he denies fever, body ache or malaise.  Symptoms present in the past few days.  Advised patient to Take meds as prescribed - Use a cool mist humidifier  -Use saline nose sprays frequently -Force fluids -For fever or aches or pains- take Tylenol or ibuprofen. -Tessalon Perles for cough -If symptoms do not improve, she may need to be COVID tested to rule this out Follow up with worsening unresolved symptoms  Problem List Items Addressed This Visit   None Visit Diagnoses     Acute cough    -  Primary   Relevant Medications   benzonatate (TESSALON PERLES) 100 MG capsule       Meds ordered this encounter  Medications   benzonatate (TESSALON PERLES) 100 MG capsule    Sig: Take 1 capsule (  100 mg total) by mouth 3 (three) times daily as needed.    Dispense:  20 capsule    Refill:  0    Order Specific Question:   Supervising Provider    Answer:   Claretta Fraise [[id number]]    Return if symptoms worsen or fail to improve.  Ivy Lynn, NP

## 2023-03-01 ENCOUNTER — Other Ambulatory Visit: Payer: Self-pay | Admitting: Family Medicine

## 2023-03-01 DIAGNOSIS — I10 Essential (primary) hypertension: Secondary | ICD-10-CM

## 2023-03-01 DIAGNOSIS — E039 Hypothyroidism, unspecified: Secondary | ICD-10-CM

## 2023-03-08 ENCOUNTER — Other Ambulatory Visit: Payer: Self-pay | Admitting: Family Medicine

## 2023-03-08 DIAGNOSIS — E039 Hypothyroidism, unspecified: Secondary | ICD-10-CM

## 2023-03-08 DIAGNOSIS — I10 Essential (primary) hypertension: Secondary | ICD-10-CM

## 2023-03-17 ENCOUNTER — Telehealth: Payer: Self-pay | Admitting: Family Medicine

## 2023-03-17 DIAGNOSIS — I10 Essential (primary) hypertension: Secondary | ICD-10-CM

## 2023-03-17 MED ORDER — LISINOPRIL 20 MG PO TABS: 20.0000 mg | ORAL_TABLET | Freq: Every day | ORAL | 0 refills | Status: DC

## 2023-03-17 NOTE — Telephone Encounter (Signed)
Aware refill sent to pharmacy ?

## 2023-03-17 NOTE — Telephone Encounter (Signed)
  Prescription Request  03/17/2023  Is this a "Controlled Substance" medicine?   Have you seen your PCP in the last 2 weeks? Made appt for 4/22  If YES, route message to pool  -  If NO, patient needs to be scheduled for appointment.  What is the name of the medication or equipment? lisinopril (ZESTRIL) 20 MG tablet   Have you contacted your pharmacy to request a refill? yes   Which pharmacy would you like this sent to?  [facility] [id number] - [place], [place]       Patient notified that their request is being sent to the clinical staff for review and that they should receive a response within 2 business days.

## 2023-04-05 ENCOUNTER — Ambulatory Visit: Payer: Managed Care, Other (non HMO) | Admitting: Family Medicine

## 2023-04-05 ENCOUNTER — Encounter: Payer: Self-pay | Admitting: Family Medicine

## 2023-04-05 VITALS — BP 135/93 | HR 80 | Ht 73.0 in | Wt 236.0 lb

## 2023-04-05 DIAGNOSIS — J40 Bronchitis, not specified as acute or chronic: Secondary | ICD-10-CM

## 2023-04-05 MED ORDER — PROMETHAZINE-DM 6.25-15 MG/5ML PO SYRP: 5.0000 mL | ORAL_SOLUTION | Freq: Four times a day (QID) | ORAL | 0 refills | Status: DC | PRN

## 2023-04-05 MED ORDER — AMOXICILLIN 500 MG PO CAPS: 500.0000 mg | ORAL_CAPSULE | Freq: Two times a day (BID) | ORAL | 0 refills | Status: DC

## 2023-04-05 NOTE — Progress Notes (Signed)
BP (!) 135/93   Pulse 80   Ht 6\' 1"  (1.854 m)   Wt 236 lb (107 kg)   SpO2 95%   BMI 31.14 kg/m    Subjective:   Patient ID: James Blackburn, male    DOB: March 03, 1976, 47 y.o.   MRN: [id number]  HPI: James Blackburn is a 47 y.o. male presenting on 04/05/2023 for Cough (Present for 2 weeks. Unable to sleep.) and chest congestion (Present for 2 weeks. Mucinex and Allegra helped but then symptoms returned.)   HPI Patient is coming in for cough and chest congestion.  He says it has been going on for couple weeks and it seemed like it was getting better with the Mucinex and Allegra but then its worsened and now is getting more into his chest but his cough is productive and keeping him up at night and has yellow-green sputum coming out.  He says it feels like it is just not improving.  He denies any fevers or chills or shortness of breath or wheezing.  Relevant past medical, surgical, family and social history reviewed and updated as indicated. Interim medical history since our last visit reviewed. Allergies and medications reviewed and updated.  Review of Systems  Constitutional:  Negative for chills and fever.  HENT:  Positive for congestion, postnasal drip and rhinorrhea. Negative for ear discharge, ear pain, sinus pressure, sneezing, sore throat and voice change.   Eyes:  Negative for pain, discharge, redness and visual disturbance.  Respiratory:  Positive for cough. Negative for shortness of breath and wheezing.   Cardiovascular:  Negative for chest pain and leg swelling.  Musculoskeletal:  Negative for gait problem.  Skin:  Negative for rash.  All other systems reviewed and are negative.   Per HPI unless specifically indicated above   Allergies as of 04/05/2023       Reactions   Entex T [pseudoephedrine-guaifenesin] Other (See Comments)   "Makes my legs ache"   Fenofibrate Other (See Comments)   myalgias        Medication List        Accurate as of April 05, 2023  10:16 AM. If you have any questions, ask your nurse or doctor.          STOP taking these medications    benzonatate 100 MG capsule Commonly known as: Lawyer Stopped by: Elige Radon Keiondra Brookover, MD   Lidocaine 3 % Crea Stopped by: Elige Radon Tierra Thoma, MD   silver sulfADIAZINE 1 % cream Commonly known as: SSD Stopped by: Elige Radon Lani Mendiola, MD       TAKE these medications    amoxicillin 500 MG capsule Commonly known as: AMOXIL Take 1 capsule (500 mg total) by mouth 2 (two) times daily. Started by: Nils Pyle, MD   levothyroxine 75 MCG tablet Commonly known as: SYNTHROID Take 1 tablet (75 mcg total) by mouth daily.   lisinopril 20 MG tablet Commonly known as: ZESTRIL Take 1 tablet (20 mg total) by mouth daily.   promethazine-dextromethorphan 6.25-15 MG/5ML syrup Commonly known as: PROMETHAZINE-DM Take 5 mLs by mouth 4 (four) times daily as needed for cough. Started by: Nils Pyle, MD   VITAMIN D PO Take by mouth.         Objective:   BP (!) 135/93   Pulse 80   Ht 6\' 1"  (1.854 m)   Wt 236 lb (107 kg)   SpO2 95%   BMI 31.14 kg/m   Wt Readings from Last  3 Encounters:  04/05/23 236 lb (107 kg)  10/12/22 227 lb (103 kg)  05/18/22 238 lb 6.4 oz (108.1 kg)    Physical Exam Vitals and nursing note reviewed.  Constitutional:      General: He is not in acute distress.    Appearance: He is well-developed. He is not diaphoretic.  HENT:     Right Ear: Tympanic membrane, ear canal and external ear normal.     Left Ear: Tympanic membrane, ear canal and external ear normal.     Nose: Mucosal edema and rhinorrhea present.     Right Sinus: Maxillary sinus tenderness present. No frontal sinus tenderness.     Left Sinus: Maxillary sinus tenderness present. No frontal sinus tenderness.     Mouth/Throat:     Pharynx: Uvula midline. No oropharyngeal exudate or posterior oropharyngeal erythema.     Tonsils: No tonsillar abscesses.  Eyes:      General: No scleral icterus.    Conjunctiva/sclera: Conjunctivae normal.  Neck:     Thyroid: No thyromegaly.  Cardiovascular:     Rate and Rhythm: Normal rate and regular rhythm.     Heart sounds: Normal heart sounds. No murmur heard. Pulmonary:     Effort: Pulmonary effort is normal. No respiratory distress.     Breath sounds: Normal breath sounds. No wheezing or rales.  Chest:     Chest wall: No tenderness.  Musculoskeletal:        General: Normal range of motion.     Cervical back: Neck supple.  Lymphadenopathy:     Cervical: No cervical adenopathy.  Skin:    General: Skin is warm and dry.     Findings: No rash.  Neurological:     Mental Status: He is alert and oriented to person, place, and time.     Coordination: Coordination normal.  Psychiatric:        Behavior: Behavior normal.       Assessment & Plan:   Problem List Items Addressed This Visit   None Visit Diagnoses     Bronchitis    -  Primary   Relevant Medications   amoxicillin (AMOXIL) 500 MG capsule   promethazine-dextromethorphan (PROMETHAZINE-DM) 6.25-15 MG/5ML syrup       Sent amoxicillin and cough syrup for him, continue with the Mucinex and Allegra and add Benadryl at night. Follow up plan: Return if symptoms worsen or fail to improve.  Counseling provided for all of the vaccine components No orders of the defined types were placed in this encounter.   Arville Care, MD [facility] 04/05/2023, 10:16 AM

## 2023-04-19 ENCOUNTER — Ambulatory Visit: Payer: Managed Care, Other (non HMO) | Admitting: Family Medicine

## 2023-04-19 ENCOUNTER — Encounter: Payer: Self-pay | Admitting: Family Medicine

## 2023-04-19 VITALS — BP 130/89 | HR 69 | Temp 96.8°F | Ht 73.0 in | Wt 238.8 lb

## 2023-04-19 DIAGNOSIS — E559 Vitamin D deficiency, unspecified: Secondary | ICD-10-CM | POA: Diagnosis not present

## 2023-04-19 DIAGNOSIS — E7800 Pure hypercholesterolemia, unspecified: Secondary | ICD-10-CM

## 2023-04-19 DIAGNOSIS — I10 Essential (primary) hypertension: Secondary | ICD-10-CM

## 2023-04-19 DIAGNOSIS — E039 Hypothyroidism, unspecified: Secondary | ICD-10-CM

## 2023-04-19 MED ORDER — LISINOPRIL 20 MG PO TABS: 20.0000 mg | ORAL_TABLET | Freq: Every day | ORAL | 2 refills | Status: DC

## 2023-04-19 MED ORDER — TIZANIDINE HCL 4 MG PO TABS: 4.0000 mg | ORAL_TABLET | Freq: Four times a day (QID) | ORAL | 1 refills | Status: DC | PRN

## 2023-04-19 MED ORDER — LEVOTHYROXINE SODIUM 75 MCG PO TABS: 75.0000 ug | ORAL_TABLET | Freq: Every day | ORAL | 3 refills | Status: DC

## 2023-04-19 MED ORDER — MELOXICAM 15 MG PO TABS
15.0000 mg | ORAL_TABLET | Freq: Every day | ORAL | 5 refills | Status: DC
Start: 2023-04-19 — End: 2024-03-23

## 2023-04-19 MED ORDER — FEXOFENADINE-PSEUDOEPHED ER 180-240 MG PO TB24: 1.0000 | ORAL_TABLET | Freq: Every evening | ORAL | 11 refills | Status: AC

## 2023-04-19 NOTE — Progress Notes (Signed)
Subjective:  Patient ID: James Blackburn, male    DOB: 04/13/1976  Age: 47 y.o. MRN: [id number]  CC: Medical Management of Chronic Issues   HPI James Blackburn presents for  follow-up on  thyroid. The patient has a history of hypothyroidism for many years. It has been stable recently. Pt. denies any change in  voice, loss of hair, heat or cold intolerance. Energy level has been adequate to good. Patient denies constipation and diarrhea. No myxedema. Medication is as noted below. Verified that pt is taking it daily on an empty stomach. Well tolerated.   in for follow-up of elevated cholesterol. Doing well without complaints on current medication. Denies side effects of statin including myalgia and arthralgia and nausea. Currently no chest pain, shortness of breath or other cardiovascular related symptoms noted.   presents for  follow-up of hypertension. Patient has no history of headache chest pain or shortness of breath or recent cough. Patient also denies symptoms of TIA such as focal numbness or weakness. Patient denies side effects from medication. States taking it regularly.  Due recheck of Vit. D because of previous deficiency.      04/05/2023    9:54 AM 10/12/2022    9:08 AM 05/18/2022    9:36 AM  Depression screen PHQ 2/9  Decreased Interest 0 0 0  Down, Depressed, Hopeless 0 0 0  PHQ - 2 Score 0 0 0  Altered sleeping 0  0  Tired, decreased energy 0  0  Change in appetite 0  0  Feeling bad or failure about yourself  0  0  Trouble concentrating 0  0  Moving slowly or fidgety/restless 0  0  Suicidal thoughts 0  0  PHQ-9 Score 0  0  Difficult doing work/chores Not difficult at all  Not difficult at all    History Gaudencio has a past medical history of Anal fissure (2008), H/O hiatal hernia, Hyperlipidemia, Hypertension, Hypothyroidism, and Nephrolithiasis.   He has a past surgical history that includes Vasectomy and Tonsillectomy.   His family history includes  COPD in his mother; Healthy in his daughter, sister, and son; Hypertension in his paternal grandfather; Pneumonia in his father.He reports that he has never smoked. He has never used smokeless tobacco. He reports that he does not drink alcohol and does not use drugs.    ROS Review of Systems  Constitutional:  Negative for fever.  Respiratory:  Negative for shortness of breath.   Cardiovascular:  Negative for chest pain.  Musculoskeletal:  Negative for arthralgias.  Skin:  Negative for rash.    Objective:  BP 130/89   Pulse 69   Temp (!) 96.8 F (36 C)   Ht 6\' 1"  (1.854 m)   Wt 238 lb 12.8 oz (108.3 kg)   SpO2 94%   BMI 31.51 kg/m   BP Readings from Last 3 Encounters:  04/19/23 130/89  04/05/23 (!) 135/93  10/12/22 (!) 134/96    Wt Readings from Last 3 Encounters:  04/19/23 238 lb 12.8 oz (108.3 kg)  04/05/23 236 lb (107 kg)  10/12/22 227 lb (103 kg)     Physical Exam Vitals reviewed.  Constitutional:      Appearance: He is well-developed.  HENT:     Head: Normocephalic and atraumatic.     Right Ear: External ear normal.     Left Ear: External ear normal.     Mouth/Throat:     Pharynx: No oropharyngeal exudate or posterior oropharyngeal erythema.  Eyes:     Pupils: Pupils are equal, round, and reactive to light.  Cardiovascular:     Rate and Rhythm: Normal rate and regular rhythm.     Heart sounds: No murmur heard. Pulmonary:     Effort: No respiratory distress.     Breath sounds: Normal breath sounds.  Musculoskeletal:     Cervical back: Normal range of motion and neck supple.  Neurological:     Mental Status: He is alert and oriented to person, place, and time.      Assessment & Plan:   Omari was seen today for medical management of chronic issues.  Diagnoses and all orders for this visit:  Essential hypertension -     CBC with Differential/Platelet -     CMP14+EGFR -     lisinopril (ZESTRIL) 20 MG tablet; Take 1 tablet (20 mg total) by mouth  daily.  Hypothyroidism, unspecified type -     TSH + free T4 -     levothyroxine (SYNTHROID) 75 MCG tablet; Take 1 tablet (75 mcg total) by mouth daily.  Pure hypercholesterolemia -     Lipid panel  Vitamin D deficiency -     VITAMIN D 25 Hydroxy (Vit-D Deficiency, Fractures)  Other orders -     fexofenadine-pseudoephedrine (ALLEGRA-D 24) 180-240 MG 24 hr tablet; Take 1 tablet by mouth every evening. For allergy and congestion -     tiZANidine (ZANAFLEX) 4 MG tablet; Take 1 tablet (4 mg total) by mouth every 6 (six) hours as needed for muscle spasms. -     meloxicam (MOBIC) 15 MG tablet; Take 1 tablet (15 mg total) by mouth daily. For joint and muscle pain       I have discontinued Oree D. Nordby "James Blackburn"'s amoxicillin and promethazine-dextromethorphan. I am also having him start on fexofenadine-pseudoephedrine, tiZANidine, and meloxicam. Additionally, I am having him maintain his VITAMIN D PO, levothyroxine, and lisinopril.  Allergies as of 04/19/2023       Reactions   Entex T [pseudoephedrine-guaifenesin] Other (See Comments)   "Makes my legs ache"   Fenofibrate Other (See Comments)   myalgias        Medication List        Accurate as of April 19, 2023 11:22 AM. If you have any questions, ask your nurse or doctor.          STOP taking these medications    amoxicillin 500 MG capsule Commonly known as: AMOXIL Stopped by: [name], MD   promethazine-dextromethorphan 6.25-15 MG/5ML syrup Commonly known as: PROMETHAZINE-DM Stopped by: [name], MD       TAKE these medications    fexofenadine-pseudoephedrine 180-240 MG 24 hr tablet Commonly known as: ALLEGRA-D 24 Take 1 tablet by mouth every evening. For allergy and congestion Started by: [name], MD   levothyroxine 75 MCG tablet Commonly known as: SYNTHROID Take 1 tablet (75 mcg total) by mouth daily.   lisinopril 20 MG tablet Commonly known as: ZESTRIL Take 1 tablet (20 mg total)  by mouth daily.   meloxicam 15 MG tablet Commonly known as: MOBIC Take 1 tablet (15 mg total) by mouth daily. For joint and muscle pain Started by: [name], MD   tiZANidine 4 MG tablet Commonly known as: ZANAFLEX Take 1 tablet (4 mg total) by mouth every 6 (six) hours as needed for muscle spasms. Started by: [name], MD   VITAMIN D PO Take by mouth.         Follow-up: Return in  about 6 months (around 10/19/2023), or if symptoms worsen or fail to improve, for Hypothyroidism, hypertension.  [name], M.D.

## 2023-04-20 LAB — CMP14+EGFR
ALT: 41 IU/L (ref 0–44)
AST: 32 IU/L (ref 0–40)
Albumin/Globulin Ratio: 1.4 (ref 1.2–2.2)
Albumin: 4.3 g/dL (ref 4.1–5.1)
Alkaline Phosphatase: 74 IU/L (ref 44–121)
BUN/Creatinine Ratio: 12 (ref 9–20)
BUN: 9 mg/dL (ref 6–24)
Bilirubin Total: 0.4 mg/dL (ref 0.0–1.2)
CO2: 22 mmol/L (ref 20–29)
Calcium: 9.2 mg/dL (ref 8.7–10.2)
Chloride: 105 mmol/L (ref 96–106)
Creatinine, Ser: 0.75 mg/dL — ABNORMAL LOW (ref 0.76–1.27)
Globulin, Total: 3 g/dL (ref 1.5–4.5)
Glucose: 92 mg/dL (ref 70–99)
Potassium: 4.2 mmol/L (ref 3.5–5.2)
Sodium: 142 mmol/L (ref 134–144)
Total Protein: 7.3 g/dL (ref 6.0–8.5)
eGFR: 113 mL/min/{1.73_m2} (ref >59)

## 2023-04-20 LAB — TSH+FREE T4
Free T4: 0.93 ng/dL (ref 0.82–1.77)
TSH: 2.7 u[IU]/mL (ref 0.450–4.500)

## 2023-04-20 LAB — CBC WITH DIFFERENTIAL/PLATELET
Basophils Absolute: 0.1 10*3/uL (ref 0.0–0.2)
Basos: 1 %
EOS (ABSOLUTE): 0.2 10*3/uL (ref 0.0–0.4)
Eos: 2 %
Hematocrit: 46.4 % (ref 37.5–51.0)
Hemoglobin: 16 g/dL (ref 13.0–17.7)
Immature Grans (Abs): 0 10*3/uL (ref 0.0–0.1)
Immature Granulocytes: 0 %
Lymphocytes Absolute: 2.8 10*3/uL (ref 0.7–3.1)
Lymphs: 40 %
MCH: 30.2 pg (ref 26.6–33.0)
MCHC: 34.5 g/dL (ref 31.5–35.7)
MCV: 88 fL (ref 79–97)
Monocytes Absolute: 0.6 10*3/uL (ref 0.1–0.9)
Monocytes: 9 %
Neutrophils Absolute: 3.3 10*3/uL (ref 1.4–7.0)
Neutrophils: 48 %
Platelets: 276 10*3/uL (ref 150–450)
RBC: 5.29 x10E6/uL (ref 4.14–5.80)
RDW: 13.4 % (ref 11.6–15.4)
WBC: 7 10*3/uL (ref 3.4–10.8)

## 2023-04-20 LAB — LIPID PANEL
Chol/HDL Ratio: 6.2 ratio — ABNORMAL HIGH (ref 0.0–5.0)
Cholesterol, Total: 181 mg/dL (ref 100–199)
HDL: 29 mg/dL — ABNORMAL LOW (ref >39)
LDL Chol Calc (NIH): 117 mg/dL — ABNORMAL HIGH (ref 0–99)
Triglycerides: 198 mg/dL — ABNORMAL HIGH (ref 0–149)
VLDL Cholesterol Cal: 35 mg/dL (ref 5–40)

## 2023-04-20 LAB — VITAMIN D 25 HYDROXY (VIT D DEFICIENCY, FRACTURES): Vit D, 25-Hydroxy: 17.1 ng/mL — ABNORMAL LOW (ref 30.0–100.0)

## 2023-07-28 ENCOUNTER — Ambulatory Visit: Payer: Managed Care, Other (non HMO) | Admitting: Family Medicine

## 2023-07-28 ENCOUNTER — Encounter: Payer: Self-pay | Admitting: Family Medicine

## 2023-07-28 DIAGNOSIS — I10 Essential (primary) hypertension: Secondary | ICD-10-CM | POA: Diagnosis not present

## 2023-07-28 MED ORDER — LISINOPRIL 30 MG PO TABS: 30.0000 mg | ORAL_TABLET | Freq: Every day | ORAL | 1 refills | Status: DC

## 2023-07-28 NOTE — Progress Notes (Signed)
Subjective:  Patient ID: James Blackburn, male    DOB: 1976/09/05  Age: 47 y.o. MRN: [id number]  CC: Hypertension   HPI RECTOR BELYEU presents for  follow-up of hypertension. Patient has no history of headache chest pain or shortness of breath or recent cough. Patient also denies symptoms of TIA such as focal numbness or weakness. Home Bp readins show systolic in the 140s and diastolic 80-90s  History Cardon has a past medical history of Anal fissure (2008), H/O hiatal hernia, Hyperlipidemia, Hypertension, Hypothyroidism, and Nephrolithiasis.   He has a past surgical history that includes Vasectomy and Tonsillectomy.   His family history includes COPD in his mother; Healthy in his daughter, sister, and son; Hypertension in his paternal grandfather; Pneumonia in his father.He reports that he has never smoked. He has never used smokeless tobacco. He reports that he does not drink alcohol and does not use drugs.  Current Outpatient Medications on File Prior to Visit  Medication Sig Dispense Refill   fexofenadine-pseudoephedrine (ALLEGRA-D 24) 180-240 MG 24 hr tablet Take 1 tablet by mouth every evening. For allergy and congestion 30 tablet 11   levothyroxine (SYNTHROID) 75 MCG tablet Take 1 tablet (75 mcg total) by mouth daily. 90 tablet 3   meloxicam (MOBIC) 15 MG tablet Take 1 tablet (15 mg total) by mouth daily. For joint and muscle pain 30 tablet 5   tiZANidine (ZANAFLEX) 4 MG tablet Take 1 tablet (4 mg total) by mouth every 6 (six) hours as needed for muscle spasms. 30 tablet 1   VITAMIN D PO Take by mouth.     No current facility-administered medications on file prior to visit.    ROS Review of Systems  Constitutional:  Negative for fever.  Respiratory:  Negative for shortness of breath.   Cardiovascular:  Negative for chest pain.  Musculoskeletal:  Negative for arthralgias.  Skin:  Negative for rash.    Objective:  BP (!) 138/93   Pulse 80   Temp 98.6 F (37 C)    Ht 6\' 1"  (1.854 m)   Wt 234 lb 6.4 oz (106.3 kg)   SpO2 94%   BMI 30.93 kg/m   BP Readings from Last 3 Encounters:  07/28/23 (!) 138/93  04/19/23 130/89  04/05/23 (!) 135/93    Wt Readings from Last 3 Encounters:  07/28/23 234 lb 6.4 oz (106.3 kg)  04/19/23 238 lb 12.8 oz (108.3 kg)  04/05/23 236 lb (107 kg)     Physical Exam Vitals reviewed.  Constitutional:      Appearance: He is well-developed.  HENT:     Head: Normocephalic and atraumatic.     Right Ear: External ear normal.     Left Ear: External ear normal.     Mouth/Throat:     Pharynx: No oropharyngeal exudate or posterior oropharyngeal erythema.  Eyes:     Pupils: Pupils are equal, round, and reactive to light.  Cardiovascular:     Rate and Rhythm: Normal rate and regular rhythm.     Heart sounds: No murmur heard. Pulmonary:     Effort: No respiratory distress.     Breath sounds: Normal breath sounds.  Musculoskeletal:     Cervical back: Normal range of motion and neck supple.  Neurological:     Mental Status: He is alert and oriented to person, place, and time.       Assessment & Plan:   [name] was seen today for hypertension.  Diagnoses and all orders for this  visit:  Essential hypertension -     lisinopril (ZESTRIL) 30 MG tablet; Take 1 tablet (30 mg total) by mouth daily.   Allergies as of 07/28/2023       Reactions   Entex T [pseudoephedrine-guaifenesin] Other (See Comments)   "Makes my legs ache"   Fenofibrate Other (See Comments)   myalgias        Medication List        Accurate as of July 28, 2023  5:02 PM. If you have any questions, ask your nurse or doctor.          fexofenadine-pseudoephedrine 180-240 MG 24 hr tablet Commonly known as: ALLEGRA-D 24 Take 1 tablet by mouth every evening. For allergy and congestion   levothyroxine 75 MCG tablet Commonly known as: SYNTHROID Take 1 tablet (75 mcg total) by mouth daily.   lisinopril 30 MG tablet Commonly  known as: ZESTRIL Take 1 tablet (30 mg total) by mouth daily. What changed:  medication strength how much to take Changed by: Udell Blasingame   meloxicam 15 MG tablet Commonly known as: MOBIC Take 1 tablet (15 mg total) by mouth daily. For joint and muscle pain   tiZANidine 4 MG tablet Commonly known as: ZANAFLEX Take 1 tablet (4 mg total) by mouth every 6 (six) hours as needed for muscle spasms.   VITAMIN D PO Take by mouth.        Meds ordered this encounter  Medications   lisinopril (ZESTRIL) 30 MG tablet    Sig: Take 1 tablet (30 mg total) by mouth daily.    Dispense:  90 tablet    Refill:  1      Follow-up: Return in about 3 months (around 10/28/2023).  [name], M.D.

## 2023-10-19 ENCOUNTER — Ambulatory Visit: Payer: Managed Care, Other (non HMO) | Admitting: Family Medicine

## 2023-11-18 ENCOUNTER — Encounter: Payer: Self-pay | Admitting: Internal Medicine

## 2024-01-17 ENCOUNTER — Telehealth: Payer: Self-pay

## 2024-01-17 NOTE — Telephone Encounter (Signed)
Mutiple attempts made to complete PV. Unable to reach patient. VM left. Pt to call the office back by 5 PM to have PV rescheduled. Pt made aware that in the event that we do not hear back from them their PV and scheduled procedure will be cancelled.  

## 2024-01-18 ENCOUNTER — Encounter: Payer: Self-pay | Admitting: Internal Medicine

## 2024-01-21 ENCOUNTER — Telehealth: Payer: Self-pay

## 2024-01-21 ENCOUNTER — Ambulatory Visit (AMBULATORY_SURGERY_CENTER): Payer: Managed Care, Other (non HMO)

## 2024-01-21 VITALS — Ht 73.0 in | Wt 230.0 lb

## 2024-01-21 DIAGNOSIS — Z860100 Personal history of colon polyps, unspecified: Secondary | ICD-10-CM

## 2024-01-21 MED ORDER — SUFLAVE 178.7 G PO SOLR: 1.0000 | Freq: Once | ORAL | 0 refills | Status: AC

## 2024-01-21 NOTE — Progress Notes (Signed)

## 2024-01-21 NOTE — Telephone Encounter (Signed)
Called for pre visit appointment.  No answer.  Left message

## 2024-01-27 ENCOUNTER — Other Ambulatory Visit: Payer: Self-pay | Admitting: Family Medicine

## 2024-01-27 DIAGNOSIS — I10 Essential (primary) hypertension: Secondary | ICD-10-CM

## 2024-02-16 ENCOUNTER — Encounter: Payer: Self-pay | Admitting: Internal Medicine

## 2024-02-16 ENCOUNTER — Encounter: Payer: Managed Care, Other (non HMO) | Admitting: Internal Medicine

## 2024-02-17 ENCOUNTER — Telehealth: Payer: Self-pay | Admitting: Internal Medicine

## 2024-02-17 NOTE — Telephone Encounter (Signed)
Ok Patient can reschedule when able to afford [facility] PCP so that another form of screening may be offered if appropriate

## 2024-02-17 NOTE — Telephone Encounter (Signed)
Patient called and stated that he needs to cancel his colonoscopy procedure due to him not being able to afford his deductible for the procedure. Please advise.

## 2024-02-17 NOTE — Telephone Encounter (Signed)
Patient called and stated that he would like to know how his colonoscopy procedure will be coded. Patient does have his colonoscopy procedure for tomorrow. Patient is requesting a call back. Please advise.

## 2024-02-18 ENCOUNTER — Encounter: Payer: Managed Care, Other (non HMO) | Admitting: Internal Medicine

## 2024-02-24 ENCOUNTER — Other Ambulatory Visit: Payer: Self-pay | Admitting: Family Medicine

## 2024-02-24 ENCOUNTER — Encounter: Payer: Self-pay | Admitting: Family Medicine

## 2024-02-24 DIAGNOSIS — I10 Essential (primary) hypertension: Secondary | ICD-10-CM

## 2024-02-24 NOTE — Telephone Encounter (Signed)
 LMTCB to schedule appt Letter mailed

## 2024-02-24 NOTE — Telephone Encounter (Signed)
 [facility] pt NTBS 30-d given 01/27/24

## 2024-02-25 ENCOUNTER — Other Ambulatory Visit: Payer: Self-pay | Admitting: Family Medicine

## 2024-02-25 DIAGNOSIS — I10 Essential (primary) hypertension: Secondary | ICD-10-CM

## 2024-03-23 ENCOUNTER — Encounter: Payer: Self-pay | Admitting: Family Medicine

## 2024-03-23 ENCOUNTER — Ambulatory Visit (INDEPENDENT_AMBULATORY_CARE_PROVIDER_SITE_OTHER): Payer: Managed Care, Other (non HMO) | Admitting: Family Medicine

## 2024-03-23 VITALS — BP 123/85 | HR 101 | Temp 97.4°F | Ht 73.0 in | Wt 244.0 lb

## 2024-03-23 DIAGNOSIS — I10 Essential (primary) hypertension: Secondary | ICD-10-CM

## 2024-03-23 DIAGNOSIS — E7800 Pure hypercholesterolemia, unspecified: Secondary | ICD-10-CM | POA: Diagnosis not present

## 2024-03-23 DIAGNOSIS — E039 Hypothyroidism, unspecified: Secondary | ICD-10-CM

## 2024-03-23 MED ORDER — LEVOTHYROXINE SODIUM 75 MCG PO TABS
75.0000 ug | ORAL_TABLET | Freq: Every day | ORAL | 3 refills | Status: AC
Start: 2024-03-23 — End: ?

## 2024-03-23 MED ORDER — LISINOPRIL 30 MG PO TABS: 30.0000 mg | ORAL_TABLET | Freq: Every day | ORAL | 0 refills | Status: DC

## 2024-03-23 NOTE — Progress Notes (Signed)
 Subjective:  James Blackburn ID: James Blackburn, male    DOB: 03/16/1976  Age: 48 y.o. MRN: [id number]  CC: Medication Refill (pended)   HPI James Blackburn presents for  follow-up of hypertension. James Blackburn has no history of headache chest pain or shortness of breath or recent cough. James Blackburn also denies symptoms of TIA such as focal numbness or weakness. James Blackburn denies side effects from medication. States taking it regularly.   follow-up on  thyroid. The James Blackburn has a history of hypothyroidism for many years. It has been stable recently. Pt. denies any change in  voice, loss of hair, heat or cold intolerance. Energy level has been adequate to good. James Blackburn denies constipation and diarrhea. No myxedema. Medication is as noted below. Verified that pt is taking it daily on an empty stomach. Well tolerated.   History James Blackburn has a past medical history of Anal fissure (2008), H/O hiatal hernia, Hyperlipidemia, Hypertension, and Hypothyroidism.   James Blackburn has a past surgical history that includes Vasectomy and Tonsillectomy.   James Blackburn family history includes COPD in James Blackburn mother; Healthy in James Blackburn daughter, sister, and son; Hypertension in James Blackburn paternal grandfather; Pneumonia in James Blackburn father.James Blackburn reports that James Blackburn has never smoked. James Blackburn has never used smokeless tobacco. James Blackburn reports that James Blackburn does not drink alcohol and does not use drugs.  Current Outpatient Medications on File Prior to Visit  Medication Sig Dispense Refill   fexofenadine-pseudoephedrine (ALLEGRA-D 24) 180-240 MG 24 hr tablet Take 1 tablet by mouth every evening. For allergy and congestion (James Blackburn not taking: Reported on 01/21/2024) 30 tablet 11   VITAMIN D PO Take by mouth.     No current facility-administered medications on file prior to visit.    ROS Review of Systems  Constitutional:  Negative for fever.  Respiratory:  Negative for shortness of breath.   Cardiovascular:  Negative for chest pain.  Musculoskeletal:  Negative for arthralgias.   Skin:  Negative for rash.    Objective:  BP 123/85   Pulse (!) 101   Temp (!) 97.4 F (36.3 C)   Ht 6\' 1"  (1.854 m)   Wt 244 lb (110.7 kg)   SpO2 98%   BMI 32.19 kg/m   BP Readings from Last 3 Encounters:  03/23/24 123/85  07/28/23 (!) 138/93  04/19/23 130/89    Wt Readings from Last 3 Encounters:  03/23/24 244 lb (110.7 kg)  01/21/24 230 lb (104.3 kg)  07/28/23 234 lb 6.4 oz (106.3 kg)     Physical Exam Vitals reviewed.  Constitutional:      Appearance: James Blackburn is well-developed.  HENT:     Head: Normocephalic and atraumatic.     Right Ear: External ear normal.     Left Ear: External ear normal.     Mouth/Throat:     Pharynx: No oropharyngeal exudate or posterior oropharyngeal erythema.  Eyes:     Pupils: Pupils are equal, round, and reactive to light.  Cardiovascular:     Rate and Rhythm: Normal rate and regular rhythm.     Heart sounds: No murmur heard. Pulmonary:     Effort: No respiratory distress.     Breath sounds: Normal breath sounds.  Musculoskeletal:     Cervical back: Normal range of motion and neck supple.  Neurological:     Mental Status: James Blackburn is alert and oriented to person, place, and time.       Assessment & Plan:  Pure hypercholesterolemia -     Lipid panel  Hypothyroidism, unspecified type -  Levothyroxine Sodium; Take 1 tablet (75 mcg total) by mouth daily.  Dispense: 90 tablet; Refill: 3 -     TSH + free T4  Essential hypertension -     Lisinopril; Take 1 tablet (30 mg total) by mouth daily.  Dispense: 30 tablet; Refill: 0 -     CMP14+EGFR -     CBC with Differential/Platelet    Allergies as of 03/23/2024       Reactions   Entex T [pseudoephedrine-guaifenesin] Other (See Comments)   "Makes my legs ache"   Fenofibrate Other (See Comments)   myalgias        Medication List        Accurate as of March 23, 2024  3:46 PM. If you have any questions, ask your nurse or doctor.          STOP taking these medications     meloxicam 15 MG tablet Commonly known as: MOBIC Stopped by: Gabbie Marzo   tiZANidine 4 MG tablet Commonly known as: ZANAFLEX Stopped by: Tiaja Hagan       TAKE these medications    fexofenadine-pseudoephedrine 180-240 MG 24 hr tablet Commonly known as: ALLEGRA-D 24 Take 1 tablet by mouth every evening. For allergy and congestion   levothyroxine 75 MCG tablet Commonly known as: SYNTHROID Take 1 tablet (75 mcg total) by mouth daily.   lisinopril 30 MG tablet Commonly known as: ZESTRIL Take 1 tablet (30 mg total) by mouth daily.   VITAMIN D PO Take by mouth.         Follow-up: No follow-ups on file.  [name], M.D.

## 2024-03-24 ENCOUNTER — Telehealth: Payer: Self-pay | Admitting: Family Medicine

## 2024-03-24 LAB — CMP14+EGFR
ALT: 73 IU/L — ABNORMAL HIGH (ref 0–44)
AST: 61 IU/L — ABNORMAL HIGH (ref 0–40)
Albumin: 4.5 g/dL (ref 4.1–5.1)
Alkaline Phosphatase: 81 IU/L (ref 44–121)
BUN/Creatinine Ratio: 14 (ref 9–20)
BUN: 10 mg/dL (ref 6–24)
Bilirubin Total: 0.4 mg/dL (ref 0.0–1.2)
CO2: 22 mmol/L (ref 20–29)
Calcium: 9.5 mg/dL (ref 8.7–10.2)
Chloride: 104 mmol/L (ref 96–106)
Creatinine, Ser: 0.71 mg/dL — ABNORMAL LOW (ref 0.76–1.27)
Globulin, Total: 2.7 g/dL (ref 1.5–4.5)
Glucose: 102 mg/dL — ABNORMAL HIGH (ref 70–99)
Potassium: 4.3 mmol/L (ref 3.5–5.2)
Sodium: 142 mmol/L (ref 134–144)
Total Protein: 7.2 g/dL (ref 6.0–8.5)
eGFR: 114 mL/min/{1.73_m2} (ref >59)

## 2024-03-24 LAB — LIPID PANEL
Chol/HDL Ratio: 8.1 ratio — ABNORMAL HIGH (ref 0.0–5.0)
Cholesterol, Total: 187 mg/dL (ref 100–199)
HDL: 23 mg/dL — ABNORMAL LOW (ref >39)
LDL Chol Calc (NIH): 79 mg/dL (ref 0–99)
Triglycerides: 529 mg/dL — ABNORMAL HIGH (ref 0–149)
VLDL Cholesterol Cal: 85 mg/dL — ABNORMAL HIGH (ref 5–40)

## 2024-03-24 LAB — CBC WITH DIFFERENTIAL/PLATELET
Basophils Absolute: 0.1 10*3/uL (ref 0.0–0.2)
Basos: 1 %
EOS (ABSOLUTE): 0.2 10*3/uL (ref 0.0–0.4)
Eos: 3 %
Hematocrit: 48.8 % (ref 37.5–51.0)
Hemoglobin: 17 g/dL (ref 13.0–17.7)
Immature Grans (Abs): 0 10*3/uL (ref 0.0–0.1)
Immature Granulocytes: 0 %
Lymphocytes Absolute: 3.1 10*3/uL (ref 0.7–3.1)
Lymphs: 37 %
MCH: 30.7 pg (ref 26.6–33.0)
MCHC: 34.8 g/dL (ref 31.5–35.7)
MCV: 88 fL (ref 79–97)
Monocytes Absolute: 1 10*3/uL — ABNORMAL HIGH (ref 0.1–0.9)
Monocytes: 12 %
Neutrophils Absolute: 4 10*3/uL (ref 1.4–7.0)
Neutrophils: 47 %
Platelets: 289 10*3/uL (ref 150–450)
RBC: 5.54 x10E6/uL (ref 4.14–5.80)
RDW: 13.1 % (ref 11.6–15.4)
WBC: 8.4 10*3/uL (ref 3.4–10.8)

## 2024-03-24 LAB — TSH+FREE T4
Free T4: 1.02 ng/dL (ref 0.82–1.77)
TSH: 2.58 u[IU]/mL (ref 0.450–4.500)

## 2024-03-24 NOTE — Telephone Encounter (Signed)
 Please review and advise.

## 2024-03-24 NOTE — Telephone Encounter (Signed)
 Copied from CRM [id number]. Topic: Clinical - Prescription Issue >> Mar 24, 2024  2:12 PM Priscille Loveless wrote: Reason for CRM: pt called and stated that the dr was going to write a prescription for sildenafil and it appears that it was not written and sent to the pharmacy. Could you please check on this and send it to the patients pharmacy as requested.

## 2024-03-27 ENCOUNTER — Other Ambulatory Visit: Payer: Self-pay | Admitting: Family Medicine

## 2024-03-27 DIAGNOSIS — I10 Essential (primary) hypertension: Secondary | ICD-10-CM

## 2024-03-27 MED ORDER — SILDENAFIL CITRATE 20 MG PO TABS: ORAL_TABLET | ORAL | 5 refills | Status: DC

## 2024-03-27 NOTE — Telephone Encounter (Signed)
 Left detailed message stating that medication was called in. LS

## 2024-03-27 NOTE — Telephone Encounter (Signed)
 Please let the patient know that I sent their prescription to their pharmacy. Thanks, WS

## 2024-03-28 ENCOUNTER — Other Ambulatory Visit: Payer: Self-pay

## 2024-03-28 DIAGNOSIS — E782 Mixed hyperlipidemia: Secondary | ICD-10-CM

## 2024-04-04 ENCOUNTER — Other Ambulatory Visit

## 2024-04-04 DIAGNOSIS — E782 Mixed hyperlipidemia: Secondary | ICD-10-CM

## 2024-04-05 ENCOUNTER — Encounter: Payer: Self-pay | Admitting: Family Medicine

## 2024-04-05 LAB — LIPID PANEL
Chol/HDL Ratio: 6.4 ratio — ABNORMAL HIGH (ref 0.0–5.0)
Cholesterol, Total: 174 mg/dL (ref 100–199)
HDL: 27 mg/dL — ABNORMAL LOW (ref >39)
LDL Chol Calc (NIH): 116 mg/dL — ABNORMAL HIGH (ref 0–99)
Triglycerides: 176 mg/dL — ABNORMAL HIGH (ref 0–149)
VLDL Cholesterol Cal: 31 mg/dL (ref 5–40)

## 2024-04-19 ENCOUNTER — Other Ambulatory Visit: Payer: Self-pay | Admitting: Family Medicine

## 2024-04-19 DIAGNOSIS — I10 Essential (primary) hypertension: Secondary | ICD-10-CM

## 2024-09-18 ENCOUNTER — Other Ambulatory Visit: Payer: Self-pay | Admitting: Family Medicine

## 2024-09-18 DIAGNOSIS — I10 Essential (primary) hypertension: Secondary | ICD-10-CM

## 2024-10-24 ENCOUNTER — Other Ambulatory Visit: Payer: Self-pay | Admitting: Family Medicine

## 2024-10-24 DIAGNOSIS — I10 Essential (primary) hypertension: Secondary | ICD-10-CM

## 2024-10-24 MED ORDER — LISINOPRIL 30 MG PO TABS: 30.0000 mg | ORAL_TABLET | Freq: Every day | ORAL | 0 refills | Status: DC

## 2024-10-24 NOTE — Telephone Encounter (Signed)
 I called pt & made him an appt on Nov 18 for med refill w/[facility].

## 2024-10-24 NOTE — Telephone Encounter (Signed)
 [facility] pt NTBS 30-d given 09/18/24

## 2024-10-24 NOTE — Addendum Note (Signed)
 Addended by: Taquila Leys D on: 10/24/2024 10:06 AM   Modules accepted: Orders

## 2024-11-14 ENCOUNTER — Ambulatory Visit: Payer: Self-pay | Admitting: Family Medicine

## 2024-11-22 ENCOUNTER — Other Ambulatory Visit: Payer: Self-pay | Admitting: Family Medicine

## 2024-11-22 DIAGNOSIS — I10 Essential (primary) hypertension: Secondary | ICD-10-CM

## 2024-12-24 ENCOUNTER — Other Ambulatory Visit: Payer: Self-pay | Admitting: Family Medicine

## 2024-12-24 DIAGNOSIS — I10 Essential (primary) hypertension: Secondary | ICD-10-CM

## 2025-01-03 ENCOUNTER — Other Ambulatory Visit: Payer: Self-pay | Admitting: Family Medicine

## 2025-01-22 ENCOUNTER — Ambulatory Visit: Admitting: Family Medicine

## 2025-01-25 ENCOUNTER — Other Ambulatory Visit: Payer: Self-pay

## 2025-01-29 ENCOUNTER — Other Ambulatory Visit: Payer: Self-pay | Admitting: Family Medicine

## 2025-01-29 ENCOUNTER — Ambulatory Visit: Admitting: Family Medicine

## 2025-01-29 DIAGNOSIS — I10 Essential (primary) hypertension: Secondary | ICD-10-CM

## 2025-02-15 ENCOUNTER — Ambulatory Visit: Admitting: Family Medicine
